# Patient Record
Sex: Female | Born: 1945 | Race: White | Hispanic: No | Marital: Married | Smoking: Former smoker
Health system: Southern US, Community
[De-identification: ages and names within clinical notes are randomized; demographics above are authoritative.]

## PROBLEM LIST (undated history)

## (undated) DIAGNOSIS — M722 Plantar fascial fibromatosis: Secondary | ICD-10-CM

## (undated) DIAGNOSIS — R32 Unspecified urinary incontinence: Secondary | ICD-10-CM

## (undated) DIAGNOSIS — D3610 Benign neoplasm of peripheral nerves and autonomic nervous system, unspecified: Secondary | ICD-10-CM

## (undated) DIAGNOSIS — R2231 Localized swelling, mass and lump, right upper limb: Secondary | ICD-10-CM

## (undated) DIAGNOSIS — K219 Gastro-esophageal reflux disease without esophagitis: Secondary | ICD-10-CM

## (undated) DIAGNOSIS — E039 Hypothyroidism, unspecified: Secondary | ICD-10-CM

## (undated) HISTORY — PX: SEPTOPLASTY: SUR1290

## (undated) HISTORY — PX: VENTRICULOPERITONEAL SHUNT: SHX204

## (undated) HISTORY — PX: COLON SURGERY: SHX602

## (undated) HISTORY — PX: DILATION AND CURETTAGE OF UTERUS: SHX78

## (undated) HISTORY — PX: RETROMASTOID CRANIOTOMY: SHX2341

## (undated) HISTORY — PX: ORIF ANKLE FRACTURE: SUR919

## (undated) HISTORY — PX: TUBAL LIGATION: SHX77

## (undated) HISTORY — PX: ABDOMINAL HYSTERECTOMY: SHX81

## (undated) HISTORY — PX: APPENDECTOMY: SHX54

---

## 1980-06-02 HISTORY — PX: BLADDER SURGERY: SHX569

## 1991-06-03 HISTORY — PX: BREAST REDUCTION SURGERY: SHX8

## 1991-06-03 HISTORY — PX: ABDOMINOPLASTY: SUR9

## 1998-01-16 ENCOUNTER — Other Ambulatory Visit: Admission: RE | Admit: 1998-01-16 | Discharge: 1998-01-16 | Payer: Self-pay | Admitting: Obstetrics and Gynecology

## 2001-04-22 ENCOUNTER — Encounter: Payer: Self-pay | Admitting: Obstetrics and Gynecology

## 2001-04-22 ENCOUNTER — Encounter: Admission: RE | Admit: 2001-04-22 | Discharge: 2001-04-22 | Payer: Self-pay | Admitting: Obstetrics and Gynecology

## 2001-04-22 ENCOUNTER — Other Ambulatory Visit: Admission: RE | Admit: 2001-04-22 | Discharge: 2001-04-22 | Payer: Self-pay | Admitting: Obstetrics and Gynecology

## 2002-05-10 ENCOUNTER — Other Ambulatory Visit: Admission: RE | Admit: 2002-05-10 | Discharge: 2002-05-10 | Payer: Self-pay | Admitting: Obstetrics and Gynecology

## 2003-05-23 ENCOUNTER — Other Ambulatory Visit: Admission: RE | Admit: 2003-05-23 | Discharge: 2003-05-23 | Payer: Self-pay | Admitting: Obstetrics and Gynecology

## 2004-01-08 ENCOUNTER — Encounter (INDEPENDENT_AMBULATORY_CARE_PROVIDER_SITE_OTHER): Payer: Self-pay | Admitting: *Deleted

## 2004-01-08 ENCOUNTER — Inpatient Hospital Stay (HOSPITAL_COMMUNITY): Admission: RE | Admit: 2004-01-08 | Discharge: 2004-01-11 | Payer: Self-pay | Admitting: Obstetrics and Gynecology

## 2006-08-06 ENCOUNTER — Emergency Department (HOSPITAL_COMMUNITY): Admission: EM | Admit: 2006-08-06 | Discharge: 2006-08-06 | Payer: Self-pay | Admitting: Emergency Medicine

## 2006-11-03 ENCOUNTER — Encounter: Admission: RE | Admit: 2006-11-03 | Discharge: 2006-11-03 | Payer: Self-pay | Admitting: Orthopedic Surgery

## 2007-01-26 ENCOUNTER — Encounter: Admission: RE | Admit: 2007-01-26 | Discharge: 2007-01-26 | Payer: Self-pay | Admitting: Obstetrics and Gynecology

## 2007-01-30 ENCOUNTER — Inpatient Hospital Stay (HOSPITAL_COMMUNITY): Admission: RE | Admit: 2007-01-30 | Discharge: 2007-01-31 | Payer: Self-pay | Admitting: Orthopedic Surgery

## 2008-06-02 HISTORY — PX: ANKLE HARDWARE REMOVAL: SHX1149

## 2010-10-01 ENCOUNTER — Other Ambulatory Visit: Payer: Self-pay | Admitting: Neurosurgery

## 2010-10-01 DIAGNOSIS — D333 Benign neoplasm of cranial nerves: Secondary | ICD-10-CM

## 2010-10-15 NOTE — Op Note (Signed)
NAMEDENEISE, GETTY              ACCOUNT NO.:  [id number]   MEDICAL RECORD NO.:  [id number]          PATIENT TYPE:  INP   LOCATION:  1530                         FACILITY:  [facility]   PHYSICIAN:  Georges Lynch. Gioffre, M.D.DATE OF BIRTH:  1945/10/07   DATE OF PROCEDURE:  01/29/2007  DATE OF DISCHARGE:                               OPERATIVE REPORT   SURGEON:  Georges Lynch. Darrelyn Hillock, M.D.   ASSISTANTDruscilla Brownie. Idolina Primer, P.A.-C.   PREOPERATIVE DIAGNOSIS:  Displaced bimalleolar fracture subluxation of  the left ankle.   POSTOPERATIVE DIAGNOSIS:  Displaced bimalleolar fracture subluxation of the left ankle.   OPERATION:  Open reduction and internal fixation of a bimalleolar  fracture distal subluxation, left ankle.   PROCEDURE:  Under general anesthesia, the patient first had 1 gram of IV  Ancef and a sterile prep was carried out of her ankle.  At that time,  the leg was exsanguinated and the Esmarch tourniquet was elevated to 325  mmHg.  Note, we utilized the mini C-arm in the operating room during the  procedure.  A medial incision was made over the medial malleolus.  She  had a large medial malleolar fracture fragment with two small fracture  fragments in the joint.  We displaced the medial malleolus and irrigated  the joint out and removed the fragments.  We then reduced the fracture,  held it in place with a towel clip.  I then inserted a guide pin across  the fracture site and then viewed the guide pin with the position with  the C-arm and it was an excellent position.  Following that, we then  measured the guide pin to be approximately 50 mm in length.  We then  made our drill hole in the appropriate fashion through the guide pin  with the cannulated drill through the guide pin.  Once again, we checked  the length with the C-arm.  Following that, we selected to use a 50 mm  length cortical screw with a washer across the fracture site.  We  reviewed the screw and it was in excellent  positions on the AP, mortise  view, and lateral view.  We thoroughly irrigated out the area and  reapproximated the wound site.  Following that, we then inspected the  fibula.  I did a closed reduction of the fibula and it was anatomic, so  I elected not to open fibular fracture site.  We closed the wound in  layers in the usual fashion far as the medial malleolar wound.  Great  care was taken not to injure the underlying sensory nerves.  We then  placed the patient in a short leg cast which was well padded and it will  be split in the recovery room.  Postop, she will be on a Coumadin  heparin protocol.           ______________________________  Georges Lynch. Darrelyn Hillock, M.D.     RAG/MEDQ  D:  01/29/2007  T:  01/30/2007  Job:  [id number]

## 2010-10-15 NOTE — H&P (Signed)
Julie Woodward, Julie Woodward              ACCOUNT NO.:  [id number]   MEDICAL RECORD NO.:  [id number]          PATIENT TYPE:  OIB   LOCATION:  1530                         FACILITY:  [facility]   PHYSICIAN:  Georges Lynch. Gioffre, M.D.DATE OF BIRTH:  11-26-1945   DATE OF ADMISSION:  01/29/2007  DATE OF DISCHARGE:                              HISTORY & PHYSICAL   CHIEF COMPLAINT:  Painful, swollen, deformed left ankle.   HISTORY OF PRESENT ILLNESS:  The patient is 65 year old female who  reports this morning she was cleaning up inside of her RV when she  slipped and fell, banging her left ankle and foot on the tub.  The  patient presented to the office this morning for evaluation.  She had an  obvious swollen, deformed left ankle.  X-rays revealed a medial and  lateral malleolar fracture with lateral displacement.   PAST MEDICAL HISTORY:  She had a recent dislocation of her left shoulder  back in March.   PAST SURGICAL HISTORY:  1. Appendectomy.  2. Partial cholecystectomy.  3. Hysterectomy.  4. Bladder tack.  5. Nasal surgery.  6. Bilateral foot surgery.  7. Breast reduction.  8. Tummy-tuck.   ALLERGIES:  DARVON.   PRIMARY CARE PHYSICIAN:  Dr. Rhea Belton.   REVIEW OF SYSTEMS:  Negative for any respiratory, cardiac, GI, GU or  neurologic issues.   PHYSICAL EXAMINATION:  VITALS:  Height is 5 feet 6.  Weight is 180  pounds, blood pressure is 128/82, pulse of 76 and regular, respirations  12, patient is afebrile.  GENERAL:  This is a healthy-appearing female who appears to be minimally  uncomfortable in a wheelchair.  HEENT:  Head is normocephalic.  Pupils equal, round and reactive.  NECK:  She had good range of motion without any discomfort.  CHEST:  Lung sounds were clear and equal bilaterally.  HEART:  Regular rate and rhythm, no murmurs, rubs or gallops.  ABDOMEN:  Soft.  Bowel sounds present.  EXTREMITIES:  Upper extremities had normal exam with range of motion of  shoulders, elbows and  wrists.  Lower Extremities: The patient had a significantly swollen and  ecchymotic left ankle with some deformity.  He's got good pulses and  good sensation, painful attempts at motion of the ankle.  She has good  motion of the knee and hip on the left, normal exam of the right.  BREAST, RECTAL AND GU:  Exams were deferred.   Review of x-rays taken in the office today shows both a medial and  lateral malleolar fracture with slight displacement.   PLAN:  The patient will be admitted to [facility] under the  care Dr. Ranee Gosselin.  Patient will undergo all routine labs and  tests prior to having an ORIF of her left ankle later today.      Jamelle Rushing, P.A.    ______________________________  Georges Lynch Darrelyn Hillock, M.D.    RWK/MEDQ  D:  01/29/2007  T:  01/30/2007  Job:  [id number]

## 2010-10-18 NOTE — H&P (Signed)
Julie Woodward, Julie Woodward                        ACCOUNT NO.:  [id number]   MEDICAL RECORD NO.:  [id number]                   PATIENT TYPE:  INP   LOCATION:  NA                                   FACILITY:  [facility]   PHYSICIAN:  Charles A. Sydnee Cabal, MD            DATE OF BIRTH:  [birth year]   DATE OF ADMISSION:  DATE OF DISCHARGE:                                HISTORY & PHYSICAL   CHIEF COMPLAINT:  Cystocele, mild uterine prolapse, small rectocele, stress  urinary incontinence.   A 65 year old para 3-0-0-3, SVD x3, notes over the last several years  progressive bulging, markedly worse over the last several months.  She feels  a bulge when she wipes.  Worse at the end of the day, resolves overnight.  She feels something that goes back in the vagina.  She thought she feel a  lump, questionable cervix, but mainly just a bulge.  She has difficulty with  urine, some urgency, rare urge incontinence, minor stress incontinence.  She  is not have to wear a pad at all times, better if she is voiding every 2-3  hours.  Her problem with urination is she starts to urinate, then she  finishes, then she has to go back and finish more, then has to go back and  finish more.  She does not think she is getting all the way finished with  single void but after all she does finish; she is completing a void after  these several tries.  She does not have any constant leaking or dribbling.  She has some pelvic pain, mainly pressure, sometimes referred to the back.  She does use some digital perineal pressure around the anus to achieve the  bowel movement but this is not replacement pressure into the vagina.  No  vaginal pressure is required and this mainly is when she is constipated.  She has seen Dr. Retta Diones who has performed formal urodynamics and planned  to have her undergo a sling procedure at the time of planned hysterectomy as  noted below.  However, with my judgment to proceed abdominally with  hysterectomy secondary to past history of complications in the abdomen, Dr.  Retta Diones recommended that I proceed with a Burch procedure and not going  with the sling procedure.  I discussed this with the patient and the patient  consents in this regard.  She gives her consent.  Accepts risks of  infection, bleeding, bowel and bladder damage, ureteral damage, blood  product risks of hepatitis and HIV exposure, prolonged catheter remaining in  situ and difficulty voiding, urinary tract infection, wound or incision  infection, fistula development, bowel resection, bowel involvement secondary  to complications as noted below, prolonged hospital stay from infection,  DVT, pulmonary embolus.   PAST MEDICAL HISTORY:  Hypercholesterolemia.   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  In her words, the surgeon stated that he had to take out some  bowel, copiously irrigate and break through adhesions of the bowel in the  pelvis and in the abdomen in order to achieve successful surgery to remove  the appendiceal abscess.  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  This was done in 1974 in [place] and records not available.  Other  surgery:  Bone spurs in her feet, deviated septum in nose, bilateral tubal  ligation, and abdominoplasty.  Bilateral tubal ligation was done in 1979 -  records not available.   MEDICATIONS:  1. Lipitor 20 mg h.s.  2. Currently phentermine and phendimetrazine but I have asked her to     discontinue these preoperatively temporarily.  3. Vagifem 25 mcg twice weekly in the vagina.  4. Other amino acids, antioxidants, calcium, magnesium, multivitamin Omega-3     menopausal formula herbal, and vitamin  E.   ALLERGIES:  No known drug allergies.   SOCIAL HISTORY:  No tobacco, ethanol, or drug use or STD exposure.  The  patient is married in a monogamous relationship with her husband.   FAMILY HISTORY:  Mother died at a young age of 18 from what they feel was  leukemia.  Father died in his 2s from emphysema.  Denies family history of  breast, uterus, ovary, cervix, colon cancer, or lymphoma; coronary artery  disease; stroke; diabetes; or hypertension.   REVIEW OF SYSTEMS:  She has some cystocele, uterine prolapse, urinary  incontinence, pelvic discomfort as noted above.  Denies fever, chills, skin  rashes, lesions, headaches, dizziness, some seasonal allergies. No chest  pain, shortness of breath, wheezing, diarrhea, constipation, bleeding,  melena, hematochezia, urgency, frequency, dysuria, incontinence, hematuria,  galactorrhea, or emotional changes.   PHYSICAL EXAMINATION:  VITAL SIGNS:  Blood pressure 120/90, respirations 16,  pulse 80.  Height 5 feet 6.5 inches, weight 161.6 pounds.  GENERAL:  Postmenopausal, no bleeding.  Alert and oriented x3, no distress.  General appearance good.  Mood and affect normal, development normal,  nutrition normal, grooming normal, no deformities.  HEENT:  Grossly within normal limits.  NECK:  Supple without thyromegaly or adenopathy.  LUNGS:  Clear bilaterally.  BACK:  NO CVAT.  Vertebral column nontender to palpation.  HEART:  Regular rate and rhythm without murmur, rub, or gallop.  BREAST:  Exam deferred given history of a normal breast exam with prior  gynecologist within the last 6 months.  ABDOMEN:  Soft, flat, and nontender.  Abdominoplasty and appendicitis-type  of vertical right lower quadrant scars are noted.  No masses are palpable,  no hernias are palpable, no hepatosplenomegaly noted.  PELVIC:  Normal external female genitalia.  Moderate cystocele.  Small to moderate uterine descensus with Valsalva and questionable descent of the   abdominal wall with tugging with a tenaculum on the uterus.  The tenaculum  does pull down the cervix to within about 2-3 cm from the introitus but not  down to the introitus.  There is a small rectocele.  Rectovaginal  examination confirms just a small rectocele and no evidence of an  enterocele.  Cystocele is midline.  On bimanual examination uterus not  enlarged, ovaries, not palpable, exam is nontender.   ASSESSMENT:  1. Cystocele.  2. Mild uterine prolapse.  3. Pelvic pain.  4. Small rectocele.  5. Stress urinary incontinence.  PLAN:  The patient will be admitted January 08, 2004 at 0700.  CBC, CMET, EKG,  anesthesia consult.  GoLYTELY bowel prep the evening prior.  Informed  consent is given as noted above.  Cefotan 1 g preoperatively.  SCTs  perioperatively.   Plan TAH/BSO, Burch procedure as feasible.  The patient understands that we  may have to abort any procedure if we find significant adhesive disease  precluding safe operation.  Furthermore, after TAH/BSO and Burch procedure  are done, will assess the vagina for support of the cystocele.  If  necessary, proceeding with anterior repair or posterior repair as indicated  based on judgment at the time of operation.  She was in agreement.  We will  proceed as outlined.                                               Charles A. Sydnee Cabal, MD    CAD/MEDQ  D:  12/27/2003  T:  12/27/2003  Job:  [id number]

## 2010-10-18 NOTE — Op Note (Signed)
[name], STAFFA                        ACCOUNT NO.:  [id number]   MEDICAL RECORD NO.:  [id number]                   PATIENT TYPE:  INP   LOCATION:  9399                                 FACILITY:  [facility]   PHYSICIAN:  Charles A. Sydnee Cabal, MD            DATE OF BIRTH:  13-May-1946   DATE OF PROCEDURE:  01/08/2004  DATE OF DISCHARGE:                                 OPERATIVE REPORT   PREOPERATIVE DIAGNOSES:  1. Uterine prolapse, mild.  2. Stress urinary incontinence.  3. Mild cystocele.  4. Mild rectocele.  5. Pelvic pain.   POSTOPERATIVE DIAGNOSES:  1. Uterine prolapse mild.  2. Stress urinary incontinence.  3. Cystocele.  4. Mild rectocele.  5. Pelvic adhesive disease.  6. Pelvic pain secondary to the above.   OPERATION PERFORMED:  1. Examination under anesthesia.  2. Exploratory laparotomy.  3. Lysis of adhesions moderate in nature.  4. Transabdominal hysterectomy, bilateral salpingo-oophorectomy.  5. Burch procedure.  6. Cystoscopy.   SURGEON:  Charles A. Sydnee Cabal, MD   ASSISTANT:  Rudy Jew. Ashley Royalty, M.D.   ANESTHESIA:  General endotracheal.   ESTIMATED BLOOD LOSS:  150 mL.   COMPLICATIONS:  None.   SPECIMENS:  Uterus, tubes and ovaries to pathology.   INSTRUMENT COUNT:  Sponge and instrument counts were correct times two.   OPERATIVE FINDINGS:  Normal tubes, uterus and ovaries.  Pelvic adhesive  disease noted in right lower quadrant.  Large bowel densely adhered to the  right adnexa and ovaries encompassing the right ovary and tube and to the  right cornual region.  Normal bladder was noted on cystoscopy.   DESCRIPTION OF PROCEDURE:  The patient was taken to the operating room,  placed in supine position.  General anesthetic was induced without  difficulty.  Sterile prep and drape was undertaken in dorsal lithotomy  position.  Pfannenstiel incision was carried down with a knife.  The fascia  was incised with a knife and Mayo scissors.  Rectus muscles  were sharply  dissected in the midline.  The peritoneum was entered with Metzenbaum  scissors carefully.  There was no damage to bowel, bladder or vascular  structures.  Balfour retractor was used.  Pelvis was explored.  Balfour  retractor was placed.  Moist laps were used to pack the bowel out of the  pelvis.  Sharp dissection was used to  dissect the bowel as noted off the  uterine cornual region.  There was no evidence of damage to the bowel.  Further dissection was used to isolate the infundibulopelvic pedicle on the  right.  This was carefully done. No damage to bowel or bladder or vascular  structures was noted.  This allowed bowel to be packed out of pelvis  adequately with adequate exposure.  After moderate amount of lysis of  adhesions, cornual clamps were placed in the uterine cornual regions  bilaterally.  Round ligaments were transected.  Infundibulopelvic pedicles  were  isolated, transected and transfixed, hemostasis was adequate.  Vesicouterine peritoneum was taken across the lower uterine segment.  The  bladder flap was taken down.  The uterine vessels were taken, transfixed and  simple stitch with good hemostasis resulted.  The cardinal ligaments were  then taken down to either side, transfixion stitch with good hemostasis.  Final clamps were placed around the cornual regions, the vaginal cornual  regions taking the vaginal angle and the uterosacral ligaments and these  were held.  The cervix was amputated from the vaginal cuff.  The vaginal  cuff was then closed with a __________ suture bilaterally and running  locking 0 Vicryl across the cuff.  Hemostasis was verified.  Irrigation was  carried out and all pedicles had good hemostasis.  Sutures and instruments  were removed.  Peritoneum was then closed.  Counts, laps were correct.  Peritoneum was closed with #2-0 Vicryl running nonlocking sutures.  Subfascial hemostasis was excellent.  Fascia was visualized inferiorly.   Retropubic space was then entered with blunt dissection, isolating the  bladder neck area. Foley catheter and urethra were palpated with adequate  visualization of this area.  Hand was inserted with fingers in the vagina  isolating the urethra with the Foley catheter in place between the first and  second digits and the tips of the fingers at the vaginal angle.  The bladder  neck was isolated.  nicely.  Under direct visualization and palpating the  area with fingers, a single suture on the left approximately 1 cm distal to  the bladder neck and approximately 1 cm lateral to the urethra was placed of  0 Ethibond.  This was taken up to Cooper's ligament and held on the right in  like location and like fashion, 0 Ethibond was placed in the endopelvic  fascia 1 cm distal to the bladder neck and 1 cm lateral to the urethra.  This was taken to Cooper's ligament and held. A second suture on the right  was placed 1 cm lateral to the first.  This was taken up to Cooper's  ligament and  held.  Integrity of the sutures was verified at both sides,  endopelvic fascia as well as clearance of the bladder neck and the urethra  and integrity of the Cooper's ligament site.  These were tied gently without  undue tension in banjo fashion.  There was clearly greater than a  fingerbreadth's clearance between the symphysis and the urethra and there  was no significant tenting effect of the sutures bilaterally of the urethra.  It was judged that adequate placement had been made well clear to the  bladder neck area itself and hemostasis was verified in the space and the  area was closed.  Fascia was closed with #1 Vicryl running and locking  suture.  Subcutaneous hemostasis was verified.  0 Vicryl, 2-0 Vicryl was  used for subcutaneous closure.  Sterile skin staples were applied.  Sterile  bandage was applied.  Attention was then given to the perineum.  The patient was then placed in universal stirrups in lithotomy  position.  Cystoscopy was  undertaken.  Using sterile water, 70 degree scope was placed.  Bladder neck  area was visualized as well as the remainder of the bladder.  There was no  evidence of damage to the bladder or stitches in the bladder.  Bladder neck  support was good.  Scope passed easily.  Catheter was removed and replaced  easily.  Examination under anesthesia was then undertaken  of the cystocele.  With the bladder drained, the cystocele was minimal.  There was moderate  support of the cystocele from the Burch procedure. It was judged that  further operative intervention was not required.  Further examination of the  rectocele after hysterectomy and support of the vaginal vault with the  uterosacral ligament vaginal vault suspension was undertaken.  Digital exam  of the rectum yielded findings of very small rectocele.  It was judged with  the support of the procedure from the repair already effected with surgery  and the patient's symptomatology, further repair was not indicated.  I had  discussed this with the patient prior to surgery that judgment would made at  the time of surgery based on examination and possibility of not proceeding  on with rectocele repair as well as cystocele repair was certainly a  possibility and she  gave informed consent for this plan as well.  At this time the procedure was  therefore terminated and the patient was taken to recovery with physician in  attendance after being extubated, having tolerated the procedure well.  Catheter had been replaced and routine postoperative course will be  undertaken as ordered.                                               Charles A. Sydnee Cabal, MD    CAD/MEDQ  D:  01/08/2004  T:  01/08/2004  Job:  [id number]

## 2010-10-18 NOTE — Discharge Summary (Signed)
Julie Woodward, Julie Woodward                        ACCOUNT NO.:  [id number]   MEDICAL RECORD NO.:  [id number]                   PATIENT TYPE:  INP   LOCATION:  9304                                 FACILITY:  [facility]   PHYSICIAN:  Charles A. Sydnee Cabal, MD            DATE OF BIRTH:  [birth year]   DATE OF ADMISSION:  01/08/2004  DATE OF DISCHARGE:                                 DISCHARGE SUMMARY   PRIMARY DISCHARGE DIAGNOSES:  1. Mild uterine prolapse.  2. Stress urinary incontinence.  3. Mild cystocele.  4. Small rectocele.  5. Pelvic pain.  6. Pelvic adhesive disease, moderate.   PROCEDURE:  Examination under anesthesia; exploratory laparotomy and lysis  of adhesions, moderate; transabdominal hysterectomy and bilateral salpingo-  oophorectomy; retropubic bladder neck suspension/Burch procedure;  cystoscopy.   DISPOSITION:  1. The patient discharged home to follow up in the office in 24 hours to     discontinue staples.  2. She was given convalescent instructions to rest at home; no lifting     greater than 25 pounds for 4 weeks; shower okay, no bath at this time for     10-14 days; no driving for 2 weeks.  Notify me of increased pain, any     vaginal bleeding of significance, any significant nausea or vomiting or     intolerance of p.o. intake.  Notify for temperature greater than 100     degrees.  3. She was given a prescription for Percocet 5/325 one to two p.o. q.4h.     p.r.n. #40 and Macrobid 100 mg p.o. b.i.d. for 10 days prophylactically.  4. She was discharged home with a Foley catheter and to return to the office     in 6 days for a voiding trial.   HISTORY AND PHYSICAL:  As dictated on the chart.   Pathology did return.  I have seen a copy of this at the office but a copy  is not available on the chart at this time.  This was benign.   HOSPITAL COURSE:  The patient was admitted, underwent surgery as noted  above.  Postoperatively, the patient had good pain control with  PCA.  Postoperative day #1 hematocrit was 31.4, hemoglobin 10.6.  She was out of  bed.  She showered in the evening after dressing was discontinued.  Clear  liquids were given, she tolerated this well.  She had no significant nausea  and vomiting.  Surgery was discussed postoperative day #2.  She continues to  do well.  Vital signs were stable.  She had good urine output.  A voiding  trial was begun.  She had no void with first attempt.  Second attempt, she  did pass a small amount which she felt but was audibly heard by her sister  when she was sitting on a toilet hat, a small amount felt to be 3-4  tablespoons, perhaps.  This was seen in the hat  and she did pass this amount  which was encouraging in that she did pass something.  She was unable to  void further.  The third try she had Foley catheter replaced.  On  postoperative day #3 she had been given general diet and tolerated this  well, flatus returned on postoperative day #2, and she had no nausea or  vomiting.  She continues to do well, was ambulating  without difficulty, pain was well controlled.  Catheter was reinserted once  again after two failed attempts to void and she is now discharged home with  follow-up as noted above with the catheter.  All questions were answered and  she will follow up as directed.                                               Charles A. Sydnee Cabal, MD    CAD/MEDQ  D:  01/11/2004  T:  01/12/2004  Job:  [id number]

## 2010-10-18 NOTE — Discharge Summary (Signed)
NAMESHONDREA, STEINERT              ACCOUNT NO.:  [id number]   MEDICAL RECORD NO.:  [id number]          PATIENT TYPE:  INP   LOCATION:  1530                         FACILITY:  [facility]   PHYSICIAN:  Georges Lynch. Gioffre, M.D.DATE OF BIRTH:  1946-03-27   DATE OF ADMISSION:  01/30/2007  DATE OF DISCHARGE:  01/31/2007                               DISCHARGE SUMMARY   ADMISSION DIAGNOSES:  1. Left ankle bimalleolar fracture.  2. Recent dislocation left shoulder.   DISCHARGE DIAGNOSES:  1. Left ankle ORIF.  2. Coumadin therapy for DVT prophylaxis.  3. Recent dislocation of left shoulder in March.   HISTORY OF PRESENT ILLNESS:  The patient is a 65 year old female who  slipped on the morning of admission.  She was seen in the office for  evaluation.  She had a left ankle bimalleolar fracture.  She was  admitted to the hospital, taken to the OR for an ORIF.   MEDICATIONS ON ADMISSION:  None.   ALLERGIES:  DARVOCET CAUSING RASH.   SURGICAL PROCEDURES:  On January 29, 2007, the patient was admitted to  [facility], taken to the OR by Dr. Worthy Rancher, assisted by  Cherly Beach PA-C.  Under general anesthesia, the patient underwent  an open reduction internal fixation of a left ankle bimalleolar  fracture.  The patient tolerated the procedure well.  There were no  complications.  The patient was transferred to the recovery room in good  condition.   CONSULTANT:  Following routine consults requested physical therapy, case  management, pharmacy for Coumadin dosing.   HOSPITAL COURSE:  On August 29, the patient was taken to [facility] where she was admitted and taken to the OR where Dr. Darrelyn Hillock  did an ORIF of the left ankle with a long screw through the medial  malleolus.  The distal fibula fracture was manipulated and had excellent  alignment in the anatomical position.  The patient was then casted.  The  patient was transferred to recovery room and then to the  orthopedic  floor in good condition.  The patient then spent 2 days postoperative  course on the orthopedic floor in which the patient was placed on  Coumadin for DVT prophylaxis.  Initially,  IV pain medicines.  She was  able to transition over to p.o. pain medications well.  The patient's  vital signs remained stable.  The patient tolerated the cast and was  able to ambulate with the crutches and she was felt to be orthopedically  stable on hospital day #2 with vitals which were stable.  She was  afebrile.  She had neurologically intact foot in a cast, and so she was  discharged to home with outpatient home health physical therapy for  ambulation training and follow-up with Dr. Darrelyn Hillock.   LABORATORY DATA:  On admission, CBC:  WBCs 9.4, hemoglobin 13.5,  hematocrit 39.0, platelets 244.  INR 0.9.  Routine chemistries:  Sodium  139, potassium 4.4, glucose 103, BUN 14, creatinine 0.70.  Estimated GFR  greater than 60.  EKG was normal sinus rhythm at 84 beats per minute.  DISCHARGE INSTRUCTIONS:  1. Activity: The patient is to ambulate with the use of a walker and      touchdown weightbearing left ankle.  2. Diet: No restrictions.  3. Wound care: The patient is to keep clean and dry.   FOLLOW UP:  The patient needs a follow-up appointment with Dr. Darrelyn Hillock  on September 3.  The patient is to call [phone] to make the  appointment.   Home health physical therapy through [facility]   MEDICATIONS:  1. Percocet 5 mg 1-2 tablets every 4-6 hours for pain if needed.  2. Robaxin 500 mg one tablet every 6 hours for pain if needed for      muscle spasms if needed.  3. Phenergan 25 mg one half to one tablet every 6 hours for nausea if      needed.   DISPOSITION:  The patient's condition upon discharge to home.   CONDITION ON DISCHARGE:  Listed as improved.      Jamelle Rushing, P.A.    ______________________________  Georges Lynch Darrelyn Hillock, M.D.    RWK/MEDQ  D:  02/09/2007  T:  02/10/2007   Job:  [id number]   cc:   Windy Fast A. Darrelyn Hillock, M.D.  Fax: [phone]

## 2010-11-12 ENCOUNTER — Ambulatory Visit
Admission: RE | Admit: 2010-11-12 | Discharge: 2010-11-12 | Disposition: A | Discharge status: Home / Self Care | Payer: PRIVATE HEALTH INSURANCE | Source: Ambulatory Visit | Attending: Neurosurgery | Admitting: Neurosurgery

## 2010-11-12 DIAGNOSIS — D333 Benign neoplasm of cranial nerves: Secondary | ICD-10-CM

## 2010-11-12 MED ORDER — GADOBENATE DIMEGLUMINE 529 MG/ML IV SOLN
16.0000 mL | Freq: Once | INTRAVENOUS | Status: AC | PRN
  Administered 2010-11-12: 16 mL via INTRAVENOUS

## 2010-12-11 ENCOUNTER — Encounter (HOSPITAL_COMMUNITY): Payer: PRIVATE HEALTH INSURANCE

## 2010-12-11 ENCOUNTER — Other Ambulatory Visit: Payer: Self-pay | Admitting: Urology

## 2010-12-11 LAB — CBC
HCT: 41.2 % (ref 36.0–46.0)
Hemoglobin: 13.2 g/dL (ref 12.0–15.0)
MCH: 28.6 pg (ref 26.0–34.0)
MCHC: 32 g/dL (ref 30.0–36.0)
MCV: 89.2 fL (ref 78.0–100.0)
RDW: 12.7 % (ref 11.5–15.5)

## 2010-12-11 LAB — SURGICAL PCR SCREEN: Staphylococcus aureus: NEGATIVE

## 2010-12-11 LAB — PROTIME-INR: INR: 0.9 (ref 0.00–1.49)

## 2010-12-20 ENCOUNTER — Observation Stay (HOSPITAL_COMMUNITY)
Admission: RE | Admit: 2010-12-20 | Discharge: 2010-12-21 | Disposition: A | Discharge status: Home / Self Care | Payer: PRIVATE HEALTH INSURANCE | Source: Ambulatory Visit | Attending: Urology | Admitting: Urology

## 2010-12-20 DIAGNOSIS — N3941 Urge incontinence: Secondary | ICD-10-CM | POA: Insufficient documentation

## 2010-12-20 DIAGNOSIS — N816 Rectocele: Secondary | ICD-10-CM | POA: Insufficient documentation

## 2010-12-20 DIAGNOSIS — K219 Gastro-esophageal reflux disease without esophagitis: Secondary | ICD-10-CM | POA: Insufficient documentation

## 2010-12-20 DIAGNOSIS — Z79899 Other long term (current) drug therapy: Secondary | ICD-10-CM | POA: Insufficient documentation

## 2010-12-20 DIAGNOSIS — Z01812 Encounter for preprocedural laboratory examination: Secondary | ICD-10-CM | POA: Insufficient documentation

## 2010-12-20 DIAGNOSIS — N8111 Cystocele, midline: Principal | ICD-10-CM | POA: Insufficient documentation

## 2010-12-20 LAB — ABO/RH: ABO/RH(D): B POS

## 2010-12-20 LAB — TYPE AND SCREEN
ABO/RH(D): B POS
Antibody Screen: NEGATIVE

## 2010-12-20 LAB — HEMOGLOBIN AND HEMATOCRIT, BLOOD: Hemoglobin: 12 g/dL (ref 12.0–15.0)

## 2010-12-20 LAB — BASIC METABOLIC PANEL
BUN: 12 mg/dL (ref 6–23)
Creatinine, Ser: 0.57 mg/dL (ref 0.50–1.10)
GFR calc non Af Amer: >60 mL/min (ref >60)
Glucose, Bld: 109 mg/dL — ABNORMAL HIGH (ref 70–99)
Potassium: 4.1 mEq/L (ref 3.5–5.1)

## 2010-12-21 LAB — BASIC METABOLIC PANEL
BUN: 8 mg/dL (ref 6–23)
CO2: 28 mEq/L (ref 19–32)
GFR calc Af Amer: >60 mL/min (ref >60)
GFR calc non Af Amer: >60 mL/min (ref >60)
Glucose, Bld: 95 mg/dL (ref 70–99)
Sodium: 139 mEq/L (ref 135–145)

## 2010-12-26 NOTE — Op Note (Signed)
NAMEIMAYA, DUFFY              ACCOUNT NO.:  [id number]  MEDICAL RECORD NO.:  [id number]  LOCATION:                                 FACILITY:  PHYSICIAN:  Martina Sinner, MD DATE OF BIRTH:  07-Aug-1945  DATE OF PROCEDURE:  12/20/2010 DATE OF DISCHARGE:                              OPERATIVE REPORT   SURGEON:  Martina Sinner, MD  ASSISTANT:  Delia Chimes, NP  PREOPERATIVE DIAGNOSIS:  Vault prolapse, cystocele, small rectocele surgery, vault suspension, cystocele repair plus graft plus cystoscopy.  POSTOPERATIVE DIAGNOSIS:  Vault prolapse, cystocele, small rectocele surgery, vault suspension, cystocele repair plus graft plus cystoscopy.  INDICATION:  Ms. Rosalia Hammers has the above diagnosis.  She has urge incontinence.  Preoperative laboratory tests were normal.  Preoperative antibiotics were given.  Next, extra care was taken with leg positioning to minimize the risk of compartment syndrome, neuropathy and DVT.  PROCEDURE IN DETAIL:  Under anesthesia, she had a somewhat shortened anterior vaginal wall, and her cuff was easily identified and distended from approximately 4 or 5.  She had a little bit of a trapdoor defect and a small central defect.  She had minimal posterior defect.  She had minimal apical defect posteriorly with the anterior wall reduced.  I marked the apex with 3-0 Vicryl.  I instilled 18 cc of lidocaine- epinephrine mixture underneath the vaginal mucosa.  I made my usual T- shaped incision anteriorly with Allises, and sharply and bluntly dissected the anterior vaginal wall from the underlying pubocervical fascia.  Without losing any length, I did a 2-layer closure of a small central defect like a small bubble.  I did not imbricate the bladder neck.  I was very happy with the repair.  I cystoscoped the patient, and there were excellent blue jets bilaterally.  There is no injury to bladder.  Endoscopically, the cystocele was reduced.  I had taken  down my double ring retractor to do this.  I bluntly dissected to the ischial spine bilaterally.  Left spine was quite flat.  I was diligent in clearing away soft tissue and could feel the flat sacrospinous ligaments bilaterally.  I placed a 0 Ethibond with Capio 1 fingerbreadth needle to the ischial spine on both sides in a straight line between the spines.  This was triple checked and I was very happy with position.  She had a rectal examination.  There is no injury to rectum and no suture in rectum.  I placed a 0 Vicryl on a UR-6 needle at the urethrovesical angle on the pelvic sidewall in usual technique.  10 x 6 dermal graft was sized in the shape of a trapezoid and sewn in place.  It was tension free.  In the midline at the apex, I sewed the graft to the vaginal apex.  I trimmed a minimal amount of vaginal mucosa bilaterally.  I closed the anterior vaginal wall with 2-0 Vicryl on CT-1 needle.  At the end of the case, she had very good length and good support anteriorly.  I repeated rectal examination.  There is no injury to rectum.  She had minimal diffuse bulging of her rectum and did not require posterior repair.  Blood loss was less than 200 mL.  Vaginal pack with Estrace cream was given.          ______________________________ Martina Sinner, MD     SAM/MEDQ  D:  12/20/2010  T:  12/20/2010  Job:  [id number]  Electronically Signed by Alfredo Martinez MD on 12/26/2010 11:14:40 AM

## 2011-03-10 ENCOUNTER — Other Ambulatory Visit: Payer: Self-pay | Admitting: Neurosurgery

## 2011-03-10 DIAGNOSIS — D333 Benign neoplasm of cranial nerves: Secondary | ICD-10-CM

## 2011-03-14 LAB — DIFFERENTIAL
Basophils Absolute: 0
Basophils Relative: 0
Eosinophils Absolute: 0
Eosinophils Relative: 0
Monocytes Absolute: 0.5
Monocytes Relative: 5
Neutro Abs: 7.7

## 2011-03-14 LAB — CBC
HCT: 39
Platelets: 244
WBC: 9.4

## 2011-03-14 LAB — COMPREHENSIVE METABOLIC PANEL
ALT: 44 — ABNORMAL HIGH
AST: 40 — ABNORMAL HIGH
Albumin: 4.2
Alkaline Phosphatase: 91
BUN: 14
Chloride: 105
Potassium: 4.4
Sodium: 139
Total Bilirubin: 0.7
Total Protein: 7.6

## 2011-04-29 ENCOUNTER — Ambulatory Visit
Admission: RE | Admit: 2011-04-29 | Discharge: 2011-04-29 | Disposition: A | Discharge status: Home / Self Care | Payer: Medicare Other | Source: Ambulatory Visit | Attending: Neurosurgery | Admitting: Neurosurgery

## 2011-04-29 DIAGNOSIS — D333 Benign neoplasm of cranial nerves: Secondary | ICD-10-CM

## 2011-04-29 MED ORDER — GADOBENATE DIMEGLUMINE 529 MG/ML IV SOLN
16.0000 mL | Freq: Once | INTRAVENOUS | Status: AC | PRN
  Administered 2011-04-29: 16 mL via INTRAVENOUS

## 2015-03-05 ENCOUNTER — Other Ambulatory Visit: Payer: Self-pay | Admitting: Gastroenterology

## 2015-03-05 DIAGNOSIS — D126 Benign neoplasm of colon, unspecified: Secondary | ICD-10-CM | POA: Diagnosis not present

## 2015-03-05 DIAGNOSIS — D128 Benign neoplasm of rectum: Secondary | ICD-10-CM | POA: Diagnosis not present

## 2015-03-05 DIAGNOSIS — K621 Rectal polyp: Secondary | ICD-10-CM | POA: Diagnosis not present

## 2015-03-05 DIAGNOSIS — K449 Diaphragmatic hernia without obstruction or gangrene: Secondary | ICD-10-CM | POA: Diagnosis not present

## 2015-03-05 DIAGNOSIS — K6389 Other specified diseases of intestine: Secondary | ICD-10-CM | POA: Diagnosis not present

## 2015-03-05 DIAGNOSIS — Z1211 Encounter for screening for malignant neoplasm of colon: Secondary | ICD-10-CM | POA: Diagnosis not present

## 2015-03-05 DIAGNOSIS — K219 Gastro-esophageal reflux disease without esophagitis: Secondary | ICD-10-CM | POA: Diagnosis not present

## 2015-03-05 DIAGNOSIS — K5730 Diverticulosis of large intestine without perforation or abscess without bleeding: Secondary | ICD-10-CM | POA: Diagnosis not present

## 2015-04-12 DIAGNOSIS — Z86018 Personal history of other benign neoplasm: Secondary | ICD-10-CM | POA: Diagnosis not present

## 2015-04-12 DIAGNOSIS — Z9889 Other specified postprocedural states: Secondary | ICD-10-CM | POA: Diagnosis not present

## 2015-04-12 DIAGNOSIS — Z982 Presence of cerebrospinal fluid drainage device: Secondary | ICD-10-CM | POA: Diagnosis not present

## 2015-04-12 DIAGNOSIS — Z09 Encounter for follow-up examination after completed treatment for conditions other than malignant neoplasm: Secondary | ICD-10-CM | POA: Diagnosis not present

## 2015-07-03 DIAGNOSIS — E8840 Mitochondrial metabolism disorder, unspecified: Secondary | ICD-10-CM | POA: Diagnosis not present

## 2015-07-03 DIAGNOSIS — G609 Hereditary and idiopathic neuropathy, unspecified: Secondary | ICD-10-CM | POA: Diagnosis not present

## 2015-07-03 DIAGNOSIS — R5383 Other fatigue: Secondary | ICD-10-CM | POA: Diagnosis not present

## 2015-07-03 DIAGNOSIS — E509 Vitamin A deficiency, unspecified: Secondary | ICD-10-CM | POA: Diagnosis not present

## 2015-07-03 DIAGNOSIS — E039 Hypothyroidism, unspecified: Secondary | ICD-10-CM | POA: Diagnosis not present

## 2015-07-03 DIAGNOSIS — E639 Nutritional deficiency, unspecified: Secondary | ICD-10-CM | POA: Diagnosis not present

## 2015-07-03 DIAGNOSIS — E559 Vitamin D deficiency, unspecified: Secondary | ICD-10-CM | POA: Diagnosis not present

## 2015-07-03 DIAGNOSIS — E279 Disorder of adrenal gland, unspecified: Secondary | ICD-10-CM | POA: Diagnosis not present

## 2015-07-03 DIAGNOSIS — E7210 Disorders of sulfur-bearing amino-acid metabolism, unspecified: Secondary | ICD-10-CM | POA: Diagnosis not present

## 2015-07-03 DIAGNOSIS — N951 Menopausal and female climacteric states: Secondary | ICD-10-CM | POA: Diagnosis not present

## 2015-07-16 DIAGNOSIS — Z86010 Personal history of colonic polyps: Secondary | ICD-10-CM | POA: Diagnosis not present

## 2015-07-16 DIAGNOSIS — K219 Gastro-esophageal reflux disease without esophagitis: Secondary | ICD-10-CM | POA: Diagnosis not present

## 2015-08-07 DIAGNOSIS — E063 Autoimmune thyroiditis: Secondary | ICD-10-CM | POA: Diagnosis not present

## 2015-08-15 DIAGNOSIS — Z0000 Encounter for general adult medical examination without abnormal findings: Secondary | ICD-10-CM | POA: Diagnosis not present

## 2015-08-15 DIAGNOSIS — Z13820 Encounter for screening for osteoporosis: Secondary | ICD-10-CM | POA: Diagnosis not present

## 2015-08-15 DIAGNOSIS — K219 Gastro-esophageal reflux disease without esophagitis: Secondary | ICD-10-CM | POA: Diagnosis not present

## 2015-08-15 DIAGNOSIS — Z23 Encounter for immunization: Secondary | ICD-10-CM | POA: Diagnosis not present

## 2015-08-15 DIAGNOSIS — E7800 Pure hypercholesterolemia, unspecified: Secondary | ICD-10-CM | POA: Diagnosis not present

## 2015-09-18 DIAGNOSIS — M859 Disorder of bone density and structure, unspecified: Secondary | ICD-10-CM | POA: Diagnosis not present

## 2015-09-18 DIAGNOSIS — M8589 Other specified disorders of bone density and structure, multiple sites: Secondary | ICD-10-CM | POA: Diagnosis not present

## 2015-09-21 DIAGNOSIS — G609 Hereditary and idiopathic neuropathy, unspecified: Secondary | ICD-10-CM | POA: Diagnosis not present

## 2015-09-21 DIAGNOSIS — E7210 Disorders of sulfur-bearing amino-acid metabolism, unspecified: Secondary | ICD-10-CM | POA: Diagnosis not present

## 2015-09-21 DIAGNOSIS — E039 Hypothyroidism, unspecified: Secondary | ICD-10-CM | POA: Diagnosis not present

## 2015-09-21 DIAGNOSIS — E559 Vitamin D deficiency, unspecified: Secondary | ICD-10-CM | POA: Diagnosis not present

## 2015-09-21 DIAGNOSIS — E7800 Pure hypercholesterolemia, unspecified: Secondary | ICD-10-CM | POA: Diagnosis not present

## 2015-11-26 DIAGNOSIS — M838 Other adult osteomalacia: Secondary | ICD-10-CM | POA: Diagnosis not present

## 2015-11-27 DIAGNOSIS — D485 Neoplasm of uncertain behavior of skin: Secondary | ICD-10-CM | POA: Diagnosis not present

## 2015-11-27 DIAGNOSIS — C44319 Basal cell carcinoma of skin of other parts of face: Secondary | ICD-10-CM | POA: Diagnosis not present

## 2015-11-27 DIAGNOSIS — L821 Other seborrheic keratosis: Secondary | ICD-10-CM | POA: Diagnosis not present

## 2015-11-27 DIAGNOSIS — L853 Xerosis cutis: Secondary | ICD-10-CM | POA: Diagnosis not present

## 2015-12-18 DIAGNOSIS — C44319 Basal cell carcinoma of skin of other parts of face: Secondary | ICD-10-CM | POA: Diagnosis not present

## 2016-01-22 DIAGNOSIS — E7800 Pure hypercholesterolemia, unspecified: Secondary | ICD-10-CM | POA: Diagnosis not present

## 2016-01-22 DIAGNOSIS — E039 Hypothyroidism, unspecified: Secondary | ICD-10-CM | POA: Diagnosis not present

## 2016-01-22 DIAGNOSIS — R739 Hyperglycemia, unspecified: Secondary | ICD-10-CM | POA: Diagnosis not present

## 2016-01-22 DIAGNOSIS — Z22322 Carrier or suspected carrier of Methicillin resistant Staphylococcus aureus: Secondary | ICD-10-CM | POA: Diagnosis not present

## 2016-01-22 DIAGNOSIS — E7210 Disorders of sulfur-bearing amino-acid metabolism, unspecified: Secondary | ICD-10-CM | POA: Diagnosis not present

## 2016-01-22 DIAGNOSIS — R799 Abnormal finding of blood chemistry, unspecified: Secondary | ICD-10-CM | POA: Diagnosis not present

## 2016-01-22 DIAGNOSIS — D509 Iron deficiency anemia, unspecified: Secondary | ICD-10-CM | POA: Diagnosis not present

## 2016-01-22 DIAGNOSIS — E559 Vitamin D deficiency, unspecified: Secondary | ICD-10-CM | POA: Diagnosis not present

## 2016-01-22 DIAGNOSIS — E060 Acute thyroiditis: Secondary | ICD-10-CM | POA: Diagnosis not present

## 2016-01-22 DIAGNOSIS — E279 Disorder of adrenal gland, unspecified: Secondary | ICD-10-CM | POA: Diagnosis not present

## 2016-02-27 DIAGNOSIS — C44319 Basal cell carcinoma of skin of other parts of face: Secondary | ICD-10-CM | POA: Diagnosis not present

## 2016-03-18 DIAGNOSIS — R0981 Nasal congestion: Secondary | ICD-10-CM | POA: Diagnosis not present

## 2016-03-18 DIAGNOSIS — J069 Acute upper respiratory infection, unspecified: Secondary | ICD-10-CM | POA: Diagnosis not present

## 2016-03-21 DIAGNOSIS — H5203 Hypermetropia, bilateral: Secondary | ICD-10-CM | POA: Diagnosis not present

## 2016-04-10 DIAGNOSIS — Z8669 Personal history of other diseases of the nervous system and sense organs: Secondary | ICD-10-CM | POA: Diagnosis not present

## 2016-04-10 DIAGNOSIS — J343 Hypertrophy of nasal turbinates: Secondary | ICD-10-CM | POA: Diagnosis not present

## 2016-04-10 DIAGNOSIS — J342 Deviated nasal septum: Secondary | ICD-10-CM | POA: Diagnosis not present

## 2016-04-10 DIAGNOSIS — Z87891 Personal history of nicotine dependence: Secondary | ICD-10-CM | POA: Diagnosis not present

## 2016-04-28 DIAGNOSIS — E279 Disorder of adrenal gland, unspecified: Secondary | ICD-10-CM | POA: Diagnosis not present

## 2016-05-17 DIAGNOSIS — M19072 Primary osteoarthritis, left ankle and foot: Secondary | ICD-10-CM | POA: Diagnosis not present

## 2016-06-16 DIAGNOSIS — H02834 Dermatochalasis of left upper eyelid: Secondary | ICD-10-CM | POA: Diagnosis not present

## 2016-06-16 DIAGNOSIS — H02831 Dermatochalasis of right upper eyelid: Secondary | ICD-10-CM | POA: Diagnosis not present

## 2016-08-18 DIAGNOSIS — H02834 Dermatochalasis of left upper eyelid: Secondary | ICD-10-CM | POA: Diagnosis not present

## 2016-08-18 DIAGNOSIS — H02831 Dermatochalasis of right upper eyelid: Secondary | ICD-10-CM | POA: Diagnosis not present

## 2017-04-28 DIAGNOSIS — M2550 Pain in unspecified joint: Secondary | ICD-10-CM | POA: Diagnosis not present

## 2017-04-28 DIAGNOSIS — K219 Gastro-esophageal reflux disease without esophagitis: Secondary | ICD-10-CM | POA: Diagnosis not present

## 2017-04-28 DIAGNOSIS — Z23 Encounter for immunization: Secondary | ICD-10-CM | POA: Diagnosis not present

## 2017-04-28 DIAGNOSIS — R0981 Nasal congestion: Secondary | ICD-10-CM | POA: Diagnosis not present

## 2017-04-28 DIAGNOSIS — H5203 Hypermetropia, bilateral: Secondary | ICD-10-CM | POA: Diagnosis not present

## 2017-04-28 DIAGNOSIS — R6889 Other general symptoms and signs: Secondary | ICD-10-CM | POA: Diagnosis not present

## 2017-07-08 DIAGNOSIS — D2272 Melanocytic nevi of left lower limb, including hip: Secondary | ICD-10-CM | POA: Diagnosis not present

## 2017-07-08 DIAGNOSIS — Z85828 Personal history of other malignant neoplasm of skin: Secondary | ICD-10-CM | POA: Diagnosis not present

## 2017-07-08 DIAGNOSIS — L814 Other melanin hyperpigmentation: Secondary | ICD-10-CM | POA: Diagnosis not present

## 2017-07-08 DIAGNOSIS — L821 Other seborrheic keratosis: Secondary | ICD-10-CM | POA: Diagnosis not present

## 2017-07-08 DIAGNOSIS — D1801 Hemangioma of skin and subcutaneous tissue: Secondary | ICD-10-CM | POA: Diagnosis not present

## 2017-07-08 DIAGNOSIS — L570 Actinic keratosis: Secondary | ICD-10-CM | POA: Diagnosis not present

## 2017-07-08 DIAGNOSIS — D2271 Melanocytic nevi of right lower limb, including hip: Secondary | ICD-10-CM | POA: Diagnosis not present

## 2017-07-30 DIAGNOSIS — D1721 Benign lipomatous neoplasm of skin and subcutaneous tissue of right arm: Secondary | ICD-10-CM | POA: Diagnosis not present

## 2017-07-30 DIAGNOSIS — D1722 Benign lipomatous neoplasm of skin and subcutaneous tissue of left arm: Secondary | ICD-10-CM | POA: Diagnosis not present

## 2017-09-01 DIAGNOSIS — D333 Benign neoplasm of cranial nerves: Secondary | ICD-10-CM | POA: Diagnosis not present

## 2017-09-01 DIAGNOSIS — Z982 Presence of cerebrospinal fluid drainage device: Secondary | ICD-10-CM | POA: Diagnosis not present

## 2017-09-01 DIAGNOSIS — Z9889 Other specified postprocedural states: Secondary | ICD-10-CM | POA: Diagnosis not present

## 2017-09-01 DIAGNOSIS — Z885 Allergy status to narcotic agent status: Secondary | ICD-10-CM | POA: Diagnosis not present

## 2017-12-21 DIAGNOSIS — M79671 Pain in right foot: Secondary | ICD-10-CM | POA: Diagnosis not present

## 2017-12-21 DIAGNOSIS — M79672 Pain in left foot: Secondary | ICD-10-CM | POA: Diagnosis not present

## 2018-01-18 DIAGNOSIS — Z23 Encounter for immunization: Secondary | ICD-10-CM | POA: Diagnosis not present

## 2018-01-18 DIAGNOSIS — K219 Gastro-esophageal reflux disease without esophagitis: Secondary | ICD-10-CM | POA: Diagnosis not present

## 2018-01-18 DIAGNOSIS — Z1389 Encounter for screening for other disorder: Secondary | ICD-10-CM | POA: Diagnosis not present

## 2018-01-18 DIAGNOSIS — M8588 Other specified disorders of bone density and structure, other site: Secondary | ICD-10-CM | POA: Diagnosis not present

## 2018-01-18 DIAGNOSIS — Z0000 Encounter for general adult medical examination without abnormal findings: Secondary | ICD-10-CM | POA: Diagnosis not present

## 2018-01-18 DIAGNOSIS — E7800 Pure hypercholesterolemia, unspecified: Secondary | ICD-10-CM | POA: Diagnosis not present

## 2018-01-18 DIAGNOSIS — R6889 Other general symptoms and signs: Secondary | ICD-10-CM | POA: Diagnosis not present

## 2018-01-18 DIAGNOSIS — D1721 Benign lipomatous neoplasm of skin and subcutaneous tissue of right arm: Secondary | ICD-10-CM | POA: Diagnosis not present

## 2018-01-20 DIAGNOSIS — M722 Plantar fascial fibromatosis: Secondary | ICD-10-CM | POA: Diagnosis not present

## 2018-02-02 ENCOUNTER — Other Ambulatory Visit: Payer: Self-pay | Admitting: General Surgery

## 2018-02-02 DIAGNOSIS — R2231 Localized swelling, mass and lump, right upper limb: Secondary | ICD-10-CM | POA: Diagnosis not present

## 2018-02-04 ENCOUNTER — Other Ambulatory Visit: Payer: Self-pay

## 2018-02-04 ENCOUNTER — Encounter (HOSPITAL_BASED_OUTPATIENT_CLINIC_OR_DEPARTMENT_OTHER): Payer: Self-pay

## 2018-02-08 NOTE — H&P (Signed)
Julie Woodward Documented: 02/02/2018 10:29 AM Location: [facility] Patient #: [id number] DOB: 1945-06-19 Married / Language: Julie Woodward / Race: White Female   History of Present Illness Julie Klein MD; 02/02/2018 11:26 AM) The patient is a 72 year old female who presents with skin lesions. Pt is a 72 yo F referred by Dr. Tamala Woodward for a right arm mass. She has had this for several years and it has been getting larger. It is not particularly painful, but it is bothersome. It is on the upper inner arm near the axilla and is uncomfortable with certain motions of the arm. She has had prior lipomas and it feels similar other than the enlargement. She is otherwise healthy. She had not had cancer.    Past Surgical History Julie Woodward; 02/02/2018 10:29 AM) Appendectomy  Foot Surgery  Bilateral. Hysterectomy (not due to cancer) - Complete  Resection of Small Bowel   Diagnostic Studies History Julie Woodward; 02/02/2018 10:29 AM) Colonoscopy  1-5 years ago Mammogram  >3 years ago Pap Smear  >5 years ago  Allergies Julie Woodward; 02/02/2018 10:30 AM) Morphine Derivatives  Nausea, Vomiting. Allergies Reconciled   Medication History Julie Woodward; 02/02/2018 10:33 AM) Boostrix (5-2.5-18.5LF-MCG/0.5 Suspension, Intramuscular) Active. Calcium (Oral) Specific strength unknown - Active. Coenzyme Q10 (Oral) Specific strength unknown - Active. Collagen (Oral) Specific strength unknown - Active. Fish Oil (Oral) Specific strength unknown - Active. Magnesium (Oral) Specific strength unknown - Active. Multi Vitamin (Oral) Specific strength unknown - Active. Lysine (Oral) Specific strength unknown - Active. Vitamin C (Oral) Specific strength unknown - Active. Vitamin D3 (Oral) Specific strength unknown - Active. Vitamin E (Oral) Specific strength unknown - Active. RaNITidine HCl (150MG  Capsule, Oral) Active. Medications Reconciled  Social History  Julie Woodward; 02/02/2018 10:29 AM) Alcohol use  Occasional alcohol use. No drug use  Tobacco use  Former smoker.  Family History Julie Woodward; 02/02/2018 10:29 AM) Thyroid problems  Julie Woodward.  Pregnancy / Birth History Julie Woodward; 02/02/2018 10:29 AM) Age at menarche  13 years. Age of menopause  2-50 Gravida  4 Maternal age  51-20 Para  3  Other Problems Julie Woodward; 02/02/2018 10:29 AM) Gastroesophageal Reflux Disease  Thyroid Disease     Review of Systems Julie Woodward; 02/02/2018 10:29 AM) General Present- Chills and Fatigue. Not Present- Appetite Loss, Fever, Night Sweats, Weight Gain and Weight Loss. Skin Present- Dryness. Not Present- Change in Wart/Mole, Hives, Jaundice, New Lesions, Non-Healing Wounds, Rash and Ulcer. HEENT Present- Hearing Loss, Seasonal Allergies and Wears glasses/contact lenses. Not Present- Earache, Hoarseness, Nose Bleed, Oral Ulcers, Ringing in the Ears, Sinus Pain, Sore Throat, Visual Disturbances and Yellow Eyes. Respiratory Present- Difficulty Breathing. Not Present- Bloody sputum, Chronic Cough, Snoring and Wheezing. Breast Not Present- Breast Mass, Breast Pain, Nipple Discharge and Skin Changes. Cardiovascular Present- Leg Cramps. Not Present- Chest Pain, Difficulty Breathing Lying Down, Palpitations, Rapid Heart Rate, Shortness of Breath and Swelling of Extremities. Gastrointestinal Present- Indigestion. Not Present- Abdominal Pain, Bloating, Bloody Stool, Change in Bowel Habits, Chronic diarrhea, Constipation, Difficulty Swallowing, Excessive gas, Gets full quickly at meals, Hemorrhoids, Nausea, Rectal Pain and Vomiting. Female Genitourinary Not Present- Frequency, Nocturia, Painful Urination, Pelvic Pain and Urgency. Musculoskeletal Present- Back Pain and Muscle Pain. Not Present- Joint Pain, Joint Stiffness, Muscle Weakness and Swelling of Extremities. Neurological Present- Decreased Memory. Not Present-  Fainting, Headaches, Numbness, Seizures, Tingling, Tremor, Trouble walking and Weakness. Psychiatric Not Present- Anxiety, Bipolar, Change in Sleep Pattern, Depression, Fearful and Frequent crying. Endocrine Present- Cold Intolerance. Not Present-  Excessive Hunger, Hair Changes, Heat Intolerance, Hot flashes and New Diabetes. Hematology Not Present- Blood Thinners, Easy Bruising, Excessive bleeding, Gland problems, HIV and Persistent Infections.  Vitals Julie Woodward; 02/02/2018 10:34 AM) 02/02/2018 10:33 AM Weight: 183.4 lb Height: 65in Height was reported by patient. Body Surface Area: 1.91 m Body Mass Index: 30.52 kg/m  Pain Level: 5/10 Temp.: 97.94F(Temporal)  Pulse: 107 (Regular)  P.OX: 96% (Room air) BP: 132/84 (Sitting, Left Arm, Standard) Plantar Fascitis Pain      Physical Exam Julie Klein MD; 02/02/2018 11:27 AM) General Mental Status-Alert. General Appearance-Consistent with stated age. Hydration-Well hydrated. Voice-Normal.  Integumentary Note: 5-6 cm mass on right upper arm at anteromedial aspect of deltoid insertion. It is mobile. It feels like a portion of it is deeper and may be inserting into the muscle. It is relatively soft.  There is also a small similar lesion near scar from previous lipoma incision on lateral left wrist.   Head and Neck Head-normocephalic, atraumatic with no lesions or palpable masses. Trachea-midline. Thyroid Gland Characteristics - normal size and consistency.  Eye Eyeball - Bilateral-Extraocular movements intact. Sclera/Conjunctiva - Bilateral-No scleral icterus.  Chest and Lung Exam Chest and lung exam reveals -quiet, even and easy respiratory effort with no use of accessory muscles and on auscultation, normal breath sounds, no adventitious sounds and normal vocal resonance. Inspection Chest Wall - Normal. Back - normal.  Cardiovascular Cardiovascular examination reveals -normal heart  sounds, regular rate and rhythm with no murmurs and normal pedal pulses bilaterally.  Abdomen Inspection Inspection of the abdomen reveals - No Hernias. Palpation/Percussion Palpation and Percussion of the abdomen reveal - Soft, Non Tender, No Rebound tenderness, No Rigidity (guarding) and No hepatosplenomegaly. Auscultation Auscultation of the abdomen reveals - Bowel sounds normal.  Neurologic Neurologic evaluation reveals -alert and oriented x 3 with no impairment of recent or remote memory. Mental Status-Normal.  Musculoskeletal Global Assessment -Note: no gross deformities.  Normal Exam - Left-Upper Extremity Strength Normal and Lower Extremity Strength Normal. Normal Exam - Right-Upper Extremity Strength Normal and Lower Extremity Strength Normal.  Lymphatic Head & Neck  General Head & Neck Lymphatics: Bilateral - Description - Normal. Axillary  General Axillary Region: Bilateral - Description - Normal. Tenderness - Non Tender. Femoral & Inguinal  Generalized Femoral & Inguinal Lymphatics: Bilateral - Description - No Generalized lymphadenopathy.    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  Will plan excision. Discussed risks of surgery including bleeding, infection, numbness, wound issues and more.  She wants to proceed.  I discussed post op restrictions. Current Plans Schedule for Surgery   Signed by Julie Klein, MD (02/02/2018 11:28 AM)

## 2018-02-08 NOTE — Pre-Procedure Instructions (Signed)
Ensure pre-surgery drink 10 oz. given to pt. with instructions to drink by 1000 DOS; pt. voiced understanding.

## 2018-02-10 ENCOUNTER — Ambulatory Visit (HOSPITAL_BASED_OUTPATIENT_CLINIC_OR_DEPARTMENT_OTHER)
Admission: RE | Admit: 2018-02-10 | Discharge: 2018-02-10 | Disposition: A | Discharge status: Home / Self Care | Payer: Medicare HMO | Source: Ambulatory Visit | Attending: General Surgery | Admitting: General Surgery

## 2018-02-10 ENCOUNTER — Ambulatory Visit (HOSPITAL_BASED_OUTPATIENT_CLINIC_OR_DEPARTMENT_OTHER): Payer: Medicare HMO | Admitting: Anesthesiology

## 2018-02-10 ENCOUNTER — Other Ambulatory Visit: Payer: Self-pay

## 2018-02-10 ENCOUNTER — Encounter (HOSPITAL_BASED_OUTPATIENT_CLINIC_OR_DEPARTMENT_OTHER)
Admission: RE | Disposition: A | Discharge status: Home / Self Care | Payer: Self-pay | Source: Ambulatory Visit | Attending: General Surgery

## 2018-02-10 ENCOUNTER — Encounter (HOSPITAL_BASED_OUTPATIENT_CLINIC_OR_DEPARTMENT_OTHER): Payer: Self-pay | Admitting: Anesthesiology

## 2018-02-10 DIAGNOSIS — R2231 Localized swelling, mass and lump, right upper limb: Secondary | ICD-10-CM | POA: Diagnosis not present

## 2018-02-10 DIAGNOSIS — K219 Gastro-esophageal reflux disease without esophagitis: Secondary | ICD-10-CM | POA: Diagnosis not present

## 2018-02-10 DIAGNOSIS — Z79899 Other long term (current) drug therapy: Secondary | ICD-10-CM | POA: Diagnosis not present

## 2018-02-10 DIAGNOSIS — Z885 Allergy status to narcotic agent status: Secondary | ICD-10-CM | POA: Diagnosis not present

## 2018-02-10 DIAGNOSIS — Z87891 Personal history of nicotine dependence: Secondary | ICD-10-CM | POA: Diagnosis not present

## 2018-02-10 DIAGNOSIS — D1721 Benign lipomatous neoplasm of skin and subcutaneous tissue of right arm: Secondary | ICD-10-CM | POA: Diagnosis not present

## 2018-02-10 HISTORY — DX: Localized swelling, mass and lump, right upper limb: R22.31

## 2018-02-10 HISTORY — DX: Hypothyroidism, unspecified: E03.9

## 2018-02-10 HISTORY — DX: Plantar fascial fibromatosis: M72.2

## 2018-02-10 HISTORY — DX: Gastro-esophageal reflux disease without esophagitis: K21.9

## 2018-02-10 HISTORY — PX: MASS EXCISION: SHX2000

## 2018-02-10 HISTORY — DX: Benign neoplasm of peripheral nerves and autonomic nervous system, unspecified: D36.10

## 2018-02-10 HISTORY — DX: Unspecified urinary incontinence: R32

## 2018-02-10 SURGERY — EXCISION MASS
Anesthesia: General | Site: Arm Upper | Laterality: Right

## 2018-02-10 MED ORDER — ACETAMINOPHEN 500 MG PO TABS
1000.0000 mg | ORAL_TABLET | ORAL | Status: AC
  Administered 2018-02-10: 1000 mg via ORAL

## 2018-02-10 MED ORDER — GABAPENTIN 300 MG PO CAPS
ORAL_CAPSULE | ORAL | Status: AC
  Filled 2018-02-10: qty 1

## 2018-02-10 MED ORDER — BUPIVACAINE-EPINEPHRINE (PF) 0.25% -1:200000 IJ SOLN
INTRAMUSCULAR | Status: AC
  Filled 2018-02-10: qty 30

## 2018-02-10 MED ORDER — OXYCODONE HCL 5 MG PO TABS: 5.0000 mg | ORAL_TABLET | Freq: Four times a day (QID) | ORAL | 0 refills | Status: DC | PRN

## 2018-02-10 MED ORDER — DEXAMETHASONE SODIUM PHOSPHATE 10 MG/ML IJ SOLN
INTRAMUSCULAR | Status: AC
  Filled 2018-02-10: qty 1

## 2018-02-10 MED ORDER — ONDANSETRON HCL 4 MG/2ML IJ SOLN
INTRAMUSCULAR | Status: DC | PRN
  Administered 2018-02-10: 4 mg via INTRAVENOUS

## 2018-02-10 MED ORDER — MIDAZOLAM HCL 2 MG/2ML IJ SOLN: 1.0000 mg | INTRAMUSCULAR | Status: DC | PRN

## 2018-02-10 MED ORDER — LACTATED RINGERS IV SOLN
INTRAVENOUS | Status: DC
  Administered 2018-02-10 (×2): via INTRAVENOUS

## 2018-02-10 MED ORDER — FENTANYL CITRATE (PF) 100 MCG/2ML IJ SOLN
50.0000 ug | INTRAMUSCULAR | Status: DC | PRN
  Administered 2018-02-10: 50 ug via INTRAVENOUS

## 2018-02-10 MED ORDER — CEFAZOLIN SODIUM-DEXTROSE 2-4 GM/100ML-% IV SOLN: 2.0000 g | INTRAVENOUS | Status: DC

## 2018-02-10 MED ORDER — LIDOCAINE 2% (20 MG/ML) 5 ML SYRINGE
INTRAMUSCULAR | Status: DC | PRN
  Administered 2018-02-10: 60 mg via INTRAVENOUS

## 2018-02-10 MED ORDER — CHLORHEXIDINE GLUCONATE CLOTH 2 % EX PADS: 6.0000 | MEDICATED_PAD | Freq: Once | CUTANEOUS | Status: DC

## 2018-02-10 MED ORDER — PROPOFOL 10 MG/ML IV BOLUS
INTRAVENOUS | Status: DC | PRN
  Administered 2018-02-10: 150 mg via INTRAVENOUS

## 2018-02-10 MED ORDER — LIDOCAINE HCL 1 % IJ SOLN
INTRAMUSCULAR | Status: DC | PRN
  Administered 2018-02-10: 14 mL via INTRAMUSCULAR

## 2018-02-10 MED ORDER — CEFAZOLIN SODIUM-DEXTROSE 2-4 GM/100ML-% IV SOLN
INTRAVENOUS | Status: AC
  Filled 2018-02-10: qty 100

## 2018-02-10 MED ORDER — GABAPENTIN 300 MG PO CAPS
300.0000 mg | ORAL_CAPSULE | ORAL | Status: AC
  Administered 2018-02-10: 300 mg via ORAL

## 2018-02-10 MED ORDER — EPHEDRINE SULFATE 50 MG/ML IJ SOLN
INTRAMUSCULAR | Status: DC | PRN
  Administered 2018-02-10: 10 mg via INTRAVENOUS

## 2018-02-10 MED ORDER — SCOPOLAMINE 1 MG/3DAYS TD PT72: 1.0000 | MEDICATED_PATCH | Freq: Once | TRANSDERMAL | Status: DC | PRN

## 2018-02-10 MED ORDER — LIDOCAINE 2% (20 MG/ML) 5 ML SYRINGE
INTRAMUSCULAR | Status: AC
  Filled 2018-02-10: qty 5

## 2018-02-10 MED ORDER — DEXAMETHASONE SODIUM PHOSPHATE 4 MG/ML IJ SOLN
INTRAMUSCULAR | Status: DC | PRN
  Administered 2018-02-10: 10 mg via INTRAVENOUS

## 2018-02-10 MED ORDER — FENTANYL CITRATE (PF) 100 MCG/2ML IJ SOLN
INTRAMUSCULAR | Status: AC
  Filled 2018-02-10: qty 2

## 2018-02-10 MED ORDER — ACETAMINOPHEN 500 MG PO TABS
ORAL_TABLET | ORAL | Status: AC
  Filled 2018-02-10: qty 2

## 2018-02-10 MED ORDER — PROPOFOL 10 MG/ML IV BOLUS
INTRAVENOUS | Status: AC
  Filled 2018-02-10: qty 20

## 2018-02-10 MED ORDER — ONDANSETRON HCL 4 MG/2ML IJ SOLN
INTRAMUSCULAR | Status: AC
  Filled 2018-02-10: qty 2

## 2018-02-10 SURGICAL SUPPLY — 53 items
ADH SKN CLS APL DERMABOND .7 (GAUZE/BANDAGES/DRESSINGS) ×1
BANDAGE ACE 4X5 VEL STRL LF (GAUZE/BANDAGES/DRESSINGS) ×1 IMPLANT
BLADE HEX COATED 2.75 (ELECTRODE) ×2 IMPLANT
BLADE SURG 10 STRL SS (BLADE) ×2 IMPLANT
BLADE SURG 15 STRL LF DISP TIS (BLADE) ×1 IMPLANT
BLADE SURG 15 STRL SS (BLADE) ×2
BNDG COHESIVE 4X5 TAN STRL (GAUZE/BANDAGES/DRESSINGS) ×1 IMPLANT
CANISTER SUCT 1200ML W/VALVE (MISCELLANEOUS) IMPLANT
CHLORAPREP W/TINT 26ML (MISCELLANEOUS) ×2 IMPLANT
COVER BACK TABLE 60X90IN (DRAPES) ×1 IMPLANT
COVER MAYO STAND STRL (DRAPES) ×2 IMPLANT
DECANTER SPIKE VIAL GLASS SM (MISCELLANEOUS) IMPLANT
DERMABOND ADVANCED (GAUZE/BANDAGES/DRESSINGS) ×1
DERMABOND ADVANCED .7 DNX12 (GAUZE/BANDAGES/DRESSINGS) ×1 IMPLANT
DRAPE EXTREMITY T 121X128X90 (DRAPE) ×1 IMPLANT
DRAPE UTILITY XL STRL (DRAPES) ×2 IMPLANT
DRSG PAD ABDOMINAL 8X10 ST (GAUZE/BANDAGES/DRESSINGS) IMPLANT
ELECT REM PT RETURN 9FT ADLT (ELECTROSURGICAL) ×2
ELECTRODE REM PT RTRN 9FT ADLT (ELECTROSURGICAL) ×1 IMPLANT
GAUZE SPONGE 4X4 12PLY STRL (GAUZE/BANDAGES/DRESSINGS) ×1 IMPLANT
GAUZE SPONGE 4X4 12PLY STRL LF (GAUZE/BANDAGES/DRESSINGS) ×1 IMPLANT
GLOVE BIO SURGEON STRL SZ 6 (GLOVE) ×2 IMPLANT
GLOVE BIO SURGEON STRL SZ7 (GLOVE) ×1 IMPLANT
GLOVE BIOGEL PI IND STRL 6.5 (GLOVE) ×1 IMPLANT
GLOVE BIOGEL PI IND STRL 7.0 (GLOVE) IMPLANT
GLOVE BIOGEL PI INDICATOR 6.5 (GLOVE) ×1
GLOVE BIOGEL PI INDICATOR 7.0 (GLOVE) ×1
GOWN STRL REUS W/ TWL LRG LVL3 (GOWN DISPOSABLE) ×1 IMPLANT
GOWN STRL REUS W/TWL 2XL LVL3 (GOWN DISPOSABLE) ×2 IMPLANT
GOWN STRL REUS W/TWL LRG LVL3 (GOWN DISPOSABLE) ×2
NDL HYPO 25X1 1.5 SAFETY (NEEDLE) ×1 IMPLANT
NEEDLE HYPO 25X1 1.5 SAFETY (NEEDLE) ×2 IMPLANT
NS IRRIG 1000ML POUR BTL (IV SOLUTION) IMPLANT
PACK BASIN DAY SURGERY FS (CUSTOM PROCEDURE TRAY) ×2 IMPLANT
PENCIL BUTTON HOLSTER BLD 10FT (ELECTRODE) ×2 IMPLANT
SLEEVE SCD COMPRESS KNEE MED (MISCELLANEOUS) ×1 IMPLANT
SPONGE LAP 18X18 RF (DISPOSABLE) ×2 IMPLANT
STAPLER VISISTAT 35W (STAPLE) IMPLANT
STOCKINETTE IMPERVIOUS LG (DRAPES) ×1 IMPLANT
STRIP CLOSURE SKIN 1/2X4 (GAUZE/BANDAGES/DRESSINGS) ×1 IMPLANT
SUT MNCRL AB 4-0 PS2 18 (SUTURE) ×2 IMPLANT
SUT SILK 3 0 TIES 17X18 (SUTURE)
SUT SILK 3-0 18XBRD TIE BLK (SUTURE) IMPLANT
SUT VIC AB 2-0 SH 27 (SUTURE)
SUT VIC AB 2-0 SH 27XBRD (SUTURE) IMPLANT
SUT VIC AB 3-0 SH 27 (SUTURE) ×2
SUT VIC AB 3-0 SH 27X BRD (SUTURE) ×1 IMPLANT
SUT VICRYL AB 3 0 TIES (SUTURE) ×1 IMPLANT
SYR BULB 3OZ (MISCELLANEOUS) ×2 IMPLANT
SYR CONTROL 10ML LL (SYRINGE) ×2 IMPLANT
TOWEL GREEN STERILE FF (TOWEL DISPOSABLE) ×2 IMPLANT
TUBE CONNECTING 20X1/4 (TUBING) IMPLANT
YANKAUER SUCT BULB TIP NO VENT (SUCTIONS) IMPLANT

## 2018-02-10 NOTE — Discharge Instructions (Addendum)
[facility] °Office Phone Number [phone] ° ° POST OP INSTRUCTIONS ° °Always review your discharge instruction sheet given to you by the facility where your surgery was performed. ° °IF YOU HAVE DISABILITY OR FAMILY LEAVE FORMS, YOU MUST BRING THEM TO THE OFFICE FOR PROCESSING.  DO NOT GIVE THEM TO YOUR DOCTOR. ° °1. A prescription for pain medication may be given to you upon discharge.  Take your pain medication as prescribed, if needed.  If narcotic pain medicine is not needed, then you may take acetaminophen (Tylenol) or ibuprofen (Advil) as needed. °2. Take your usually prescribed medications unless otherwise directed °3. If you need a refill on your pain medication, please contact your pharmacy.  They will contact our office to request authorization.  Prescriptions will not be filled after 5pm or on week-ends. °4. You should eat very light the first 24 hours after surgery, such as soup, crackers, pudding, etc.  Resume your normal diet the day after surgery °5. It is common to experience some constipation if taking pain medication after surgery.  Increasing fluid intake and taking a stool softener will usually help or prevent this problem from occurring.  A mild laxative (Milk of Magnesia or Miralax) should be taken according to package directions if there are no bowel movements after 48 hours. °6. You may shower in 48 hours.  The surgical glue will flake off in 2-3 weeks.   °7. ACTIVITIES:  No strenuous activity or heavy lifting for 1 week.   °a. You may drive when you no longer are taking prescription pain medication, you can comfortably wear a seatbelt, and you can safely maneuver your car and apply brakes. °b. RETURN TO WORK:  __________1 week_______________ °You should see your doctor in the office for a follow-up appointment approximately three-four weeks after your surgery.   ° °WHEN TO CALL YOUR DOCTOR: °1. Fever over 101.0 °2. Nausea and/or vomiting. °3. Extreme swelling or  bruising. °4. Continued bleeding from incision. °5. Increased pain, redness, or drainage from the incision. ° °The clinic staff is available to answer your questions during regular business hours.  Please don’t hesitate to call and ask to speak to one of the nurses for clinical concerns.  If you have a medical emergency, go to the nearest emergency room or call 911.  A surgeon from [facility] is always on call at the hospital. ° °For further questions, please visit [email or web address]  ° ° ° °Post Anesthesia Home Care Instructions ° °Activity: °Get plenty of rest for the remainder of the day. A responsible individual must stay with you for 24 hours following the procedure.  °For the next 24 hours, DO NOT: °-Drive a car °-Operate machinery °-Drink alcoholic beverages °-Take any medication unless instructed by your physician °-Make any legal decisions or sign important papers. ° °Meals: °Start with liquid foods such as gelatin or soup. Progress to regular foods as tolerated. Avoid greasy, spicy, heavy foods. If nausea and/or vomiting occur, drink only clear liquids until the nausea and/or vomiting subsides. Call your physician if vomiting continues. ° °Special Instructions/Symptoms: °Your throat may feel dry or sore from the anesthesia or the breathing tube placed in your throat during surgery. If this causes discomfort, gargle with warm salt water. The discomfort should disappear within 24 hours. ° °If you had a scopolamine patch placed behind your ear for the management of post- operative nausea and/or vomiting: ° °1. The medication in the patch is effective for 72 hours, after which it   removed.  Wrap patch in a tissue and discard in the trash. Wash hands thoroughly with soap and water. °2. You may remove the patch earlier than 72 hours if you experience unpleasant side effects which may include dry mouth, dizziness or visual disturbances. °3. Avoid touching the patch. Wash your hands with  soap and water after contact with the patch. °  ° °

## 2018-02-10 NOTE — Anesthesia Preprocedure Evaluation (Signed)
Anesthesia Evaluation  Patient identified by MRN, date of birth, ID band Patient awake    Reviewed: Allergy & Precautions, NPO status , Patient's Chart, lab work & pertinent test results  Airway Mallampati: II  TM Distance: >3 FB Neck ROM: Full    Dental no notable dental hx.    Pulmonary neg pulmonary ROS,    Pulmonary exam normal breath sounds clear to auscultation       Cardiovascular negative cardio ROS Normal cardiovascular exam Rhythm:Regular Rate:Normal     Neuro/Psych negative neurological ROS  negative psych ROS   GI/Hepatic Neg liver ROS, GERD  Medicated and Controlled,  Endo/Other  negative endocrine ROS  Renal/GU negative Renal ROS  negative genitourinary   Musculoskeletal negative musculoskeletal ROS (+)   Abdominal   Peds negative pediatric ROS (+)  Hematology negative hematology ROS (+)   Anesthesia Other Findings   Reproductive/Obstetrics negative OB ROS                             Anesthesia Physical Anesthesia Plan  ASA: II  Anesthesia Plan: General   Post-op Pain Management:    Induction: Intravenous  PONV Risk Score and Plan: 3 and Ondansetron and Treatment may vary due to age or medical condition  Airway Management Planned: LMA  Additional Equipment:   Intra-op Plan:   Post-operative Plan: Extubation in OR  Informed Consent: I have reviewed the patients History and Physical, chart, labs and discussed the procedure including the risks, benefits and alternatives for the proposed anesthesia with the patient or authorized representative who has indicated his/her understanding and acceptance.   Dental advisory given  Plan Discussed with: CRNA  Anesthesia Plan Comments:         Anesthesia Quick Evaluation

## 2018-02-10 NOTE — Transfer of Care (Signed)
Immediate Anesthesia Transfer of Care Note  Patient: Julie Woodward  Procedure(s) Performed: EXCISION OF RIGHT UPPER ARM MASS (Right Arm Upper)  Patient Location: PACU  Anesthesia Type:General  Level of Consciousness: sedated  Airway & Oxygen Therapy: Patient Spontanous Breathing and Patient connected to face mask oxygen  Post-op Assessment: Report given to RN and Post -op Vital signs reviewed and stable  Post vital signs: Reviewed and stable  Last Vitals:  Vitals Value Taken Time  BP 110/66 02/10/2018  2:30 PM  Temp    Pulse 92 02/10/2018  2:31 PM  Resp 13 02/10/2018  2:31 PM  SpO2 100 % 02/10/2018  2:31 PM    Last Pain:  Vitals:   02/10/18 1212  TempSrc: Oral  PainSc: 0-No pain         Complications: No apparent anesthesia complications

## 2018-02-10 NOTE — Op Note (Signed)
PRE-OPERATIVE DIAGNOSIS: right upper arm mass  POST-OPERATIVE DIAGNOSIS:  Same  PROCEDURE:  Procedure(s): Excision of right upper arm mass, 4 cm, partially subcutaneous and partially subfascial.    SURGEON:  Surgeon(s): [name], MD  ANESTHESIA:   local and general  DRAINS: none   LOCAL MEDICATIONS USED:  BUPIVICAINE  and LIDOCAINE   SPECIMEN:  Source of Specimen:  right arm mass  DISPOSITION OF SPECIMEN:  PATHOLOGY  COUNTS:  YES  DICTATION: .Dragon Dictation  PLAN OF CARE: Discharge to home after PACU  PATIENT DISPOSITION:  PACU - hemodynamically stable.  FINDINGS:  Fatty mass consistent with lipoma.  Some portions extended into deltoid musculature  EBL: min  PROCEDURE:  Patient was identified in the holding area, taken to the operating room and placed supine on the OR table.  General anesthesia was induced.  Her right arm was prepped and draped in sterile fashion. A timeout was performed according to the surgical safety checklist.  When all was correct, we continued.    Local anesthetic was infiltrated just over the mass and the overlying skin.  A longitudinal incision was made with a #10 blade.  Skin hooks were used to assist with creation of skin flaps over the mass.  Once the mass was exposed, goulet retractors were used to assist with the dissection.    The mass had a vein overlying it which was clamped, divided, and tied off.  The mass extended through the fascia of the deltoid.  This had to be opened to allow it to pop out.  The cautery was used for the dissection.  Once the mass was dissected away from the surrounding tissue, the cavity was irrigated.  Hemostasis was achieved with cautery.    The skin was reapproximated with interrupted 3-0 vicryl deep dermal sutures and 4-0 monocryl running subcuticular suture.  The skin was then cleaned, dried, and dressed with dermabond.  Due to the empty space where the mass was, the upper arm was wrapped with an ace wrap.     The patient was allowed to emerge from anesthesia and was taken to the PACU in stable condition.  Needle, sponge, and instrument counts were correct x 2.

## 2018-02-10 NOTE — Interval H&P Note (Signed)
History and Physical Interval Note:  02/10/2018 12:31 PM  Julie Woodward  has presented today for surgery, with the diagnosis of RIGHT UPPER ARM MASS  The various methods of treatment have been discussed with the patient and family. After consideration of risks, benefits and other options for treatment, the patient has consented to  Procedure(s): EXCISION OF RIGHT UPPER ARM MASS (Right) as a surgical intervention .  The patient's history has been reviewed, patient examined, no change in status, stable for surgery.  I have reviewed the patient's chart and labs.  Questions were answered to the patient's satisfaction.     [name]

## 2018-02-10 NOTE — Anesthesia Procedure Notes (Signed)
Procedure Name: LMA Insertion Date/Time: 02/10/2018 1:42 PM Performed by: Maryella Shivers, CRNA Pre-anesthesia Checklist: Patient identified, Emergency Drugs available, Suction available and Patient being monitored Patient Re-evaluated:Patient Re-evaluated prior to induction Oxygen Delivery Method: Circle system utilized Preoxygenation: Pre-oxygenation with 100% oxygen Induction Type: IV induction Ventilation: Mask ventilation without difficulty LMA: LMA inserted LMA Size: 4.0 Number of attempts: 1 Airway Equipment and Method: Bite block Placement Confirmation: positive ETCO2 Tube secured with: Tape Dental Injury: Teeth and Oropharynx as per pre-operative assessment

## 2018-02-10 NOTE — Anesthesia Postprocedure Evaluation (Signed)
Anesthesia Post Note  Patient: JANIRA MANDELL  Procedure(s) Performed: EXCISION OF RIGHT UPPER ARM MASS (Right Arm Upper)     Patient location during evaluation: PACU Anesthesia Type: General Level of consciousness: awake and alert Pain management: pain level controlled Vital Signs Assessment: post-procedure vital signs reviewed and stable Respiratory status: spontaneous breathing, nonlabored ventilation, respiratory function stable and patient connected to nasal cannula oxygen Cardiovascular status: blood pressure returned to baseline and stable Postop Assessment: no apparent nausea or vomiting Anesthetic complications: no    Last Vitals:  Vitals:   02/10/18 1445 02/10/18 1504  BP: 118/72 124/77  Pulse: 90 81  Resp: 11 18  Temp:  36.8 C  SpO2: 95% 96%    Last Pain:  Vitals:   02/10/18 1504  TempSrc:   PainSc: 0-No pain                 Montez Hageman

## 2018-02-11 ENCOUNTER — Encounter (HOSPITAL_BASED_OUTPATIENT_CLINIC_OR_DEPARTMENT_OTHER): Payer: Self-pay | Admitting: General Surgery

## 2018-02-15 NOTE — Progress Notes (Signed)
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

## 2018-02-22 DIAGNOSIS — M79671 Pain in right foot: Secondary | ICD-10-CM | POA: Diagnosis not present

## 2018-03-01 DIAGNOSIS — Z1231 Encounter for screening mammogram for malignant neoplasm of breast: Secondary | ICD-10-CM | POA: Diagnosis not present

## 2018-03-22 DIAGNOSIS — M79672 Pain in left foot: Secondary | ICD-10-CM | POA: Diagnosis not present

## 2018-03-22 DIAGNOSIS — M722 Plantar fascial fibromatosis: Secondary | ICD-10-CM | POA: Diagnosis not present

## 2018-03-22 DIAGNOSIS — M79671 Pain in right foot: Secondary | ICD-10-CM | POA: Diagnosis not present

## 2018-03-31 DIAGNOSIS — M79671 Pain in right foot: Secondary | ICD-10-CM | POA: Diagnosis not present

## 2018-03-31 DIAGNOSIS — M79672 Pain in left foot: Secondary | ICD-10-CM | POA: Diagnosis not present

## 2018-04-05 DIAGNOSIS — M722 Plantar fascial fibromatosis: Secondary | ICD-10-CM | POA: Diagnosis not present

## 2018-04-07 ENCOUNTER — Other Ambulatory Visit (HOSPITAL_COMMUNITY): Payer: Self-pay | Admitting: Orthopedic Surgery

## 2018-04-12 DIAGNOSIS — M9914 Subluxation complex (vertebral) of sacral region: Secondary | ICD-10-CM | POA: Diagnosis not present

## 2018-04-12 DIAGNOSIS — M9905 Segmental and somatic dysfunction of pelvic region: Secondary | ICD-10-CM | POA: Diagnosis not present

## 2018-04-12 DIAGNOSIS — Q72812 Congenital shortening of left lower limb: Secondary | ICD-10-CM | POA: Diagnosis not present

## 2018-04-13 DIAGNOSIS — M9905 Segmental and somatic dysfunction of pelvic region: Secondary | ICD-10-CM | POA: Diagnosis not present

## 2018-04-13 DIAGNOSIS — Q72812 Congenital shortening of left lower limb: Secondary | ICD-10-CM | POA: Diagnosis not present

## 2018-04-13 DIAGNOSIS — M9914 Subluxation complex (vertebral) of sacral region: Secondary | ICD-10-CM | POA: Diagnosis not present

## 2018-04-15 DIAGNOSIS — M9914 Subluxation complex (vertebral) of sacral region: Secondary | ICD-10-CM | POA: Diagnosis not present

## 2018-04-15 DIAGNOSIS — M9905 Segmental and somatic dysfunction of pelvic region: Secondary | ICD-10-CM | POA: Diagnosis not present

## 2018-04-15 DIAGNOSIS — Q72812 Congenital shortening of left lower limb: Secondary | ICD-10-CM | POA: Diagnosis not present

## 2018-04-19 DIAGNOSIS — M9914 Subluxation complex (vertebral) of sacral region: Secondary | ICD-10-CM | POA: Diagnosis not present

## 2018-04-19 DIAGNOSIS — M9905 Segmental and somatic dysfunction of pelvic region: Secondary | ICD-10-CM | POA: Diagnosis not present

## 2018-04-19 DIAGNOSIS — Q72812 Congenital shortening of left lower limb: Secondary | ICD-10-CM | POA: Diagnosis not present

## 2018-04-20 DIAGNOSIS — Q72812 Congenital shortening of left lower limb: Secondary | ICD-10-CM | POA: Diagnosis not present

## 2018-04-20 DIAGNOSIS — M9905 Segmental and somatic dysfunction of pelvic region: Secondary | ICD-10-CM | POA: Diagnosis not present

## 2018-04-20 DIAGNOSIS — M9914 Subluxation complex (vertebral) of sacral region: Secondary | ICD-10-CM | POA: Diagnosis not present

## 2018-04-22 DIAGNOSIS — M9914 Subluxation complex (vertebral) of sacral region: Secondary | ICD-10-CM | POA: Diagnosis not present

## 2018-04-22 DIAGNOSIS — Q72812 Congenital shortening of left lower limb: Secondary | ICD-10-CM | POA: Diagnosis not present

## 2018-04-22 DIAGNOSIS — M9905 Segmental and somatic dysfunction of pelvic region: Secondary | ICD-10-CM | POA: Diagnosis not present

## 2018-04-26 DIAGNOSIS — M9905 Segmental and somatic dysfunction of pelvic region: Secondary | ICD-10-CM | POA: Diagnosis not present

## 2018-04-26 DIAGNOSIS — Q72812 Congenital shortening of left lower limb: Secondary | ICD-10-CM | POA: Diagnosis not present

## 2018-04-26 DIAGNOSIS — M9914 Subluxation complex (vertebral) of sacral region: Secondary | ICD-10-CM | POA: Diagnosis not present

## 2018-05-03 DIAGNOSIS — Q72812 Congenital shortening of left lower limb: Secondary | ICD-10-CM | POA: Diagnosis not present

## 2018-05-03 DIAGNOSIS — M9905 Segmental and somatic dysfunction of pelvic region: Secondary | ICD-10-CM | POA: Diagnosis not present

## 2018-05-03 DIAGNOSIS — M9914 Subluxation complex (vertebral) of sacral region: Secondary | ICD-10-CM | POA: Diagnosis not present

## 2018-05-04 DIAGNOSIS — M9914 Subluxation complex (vertebral) of sacral region: Secondary | ICD-10-CM | POA: Diagnosis not present

## 2018-05-04 DIAGNOSIS — M5386 Other specified dorsopathies, lumbar region: Secondary | ICD-10-CM | POA: Diagnosis not present

## 2018-05-04 DIAGNOSIS — Q72812 Congenital shortening of left lower limb: Secondary | ICD-10-CM | POA: Diagnosis not present

## 2018-05-04 DIAGNOSIS — M9903 Segmental and somatic dysfunction of lumbar region: Secondary | ICD-10-CM | POA: Diagnosis not present

## 2018-05-04 DIAGNOSIS — M9905 Segmental and somatic dysfunction of pelvic region: Secondary | ICD-10-CM | POA: Diagnosis not present

## 2018-05-06 ENCOUNTER — Ambulatory Visit (HOSPITAL_BASED_OUTPATIENT_CLINIC_OR_DEPARTMENT_OTHER): Admit: 2018-05-06 | Payer: Medicare HMO | Admitting: Orthopedic Surgery

## 2018-05-06 ENCOUNTER — Encounter (HOSPITAL_BASED_OUTPATIENT_CLINIC_OR_DEPARTMENT_OTHER): Payer: Self-pay

## 2018-05-06 DIAGNOSIS — M9914 Subluxation complex (vertebral) of sacral region: Secondary | ICD-10-CM | POA: Diagnosis not present

## 2018-05-06 DIAGNOSIS — M5386 Other specified dorsopathies, lumbar region: Secondary | ICD-10-CM | POA: Diagnosis not present

## 2018-05-06 DIAGNOSIS — Q72812 Congenital shortening of left lower limb: Secondary | ICD-10-CM | POA: Diagnosis not present

## 2018-05-06 DIAGNOSIS — M9903 Segmental and somatic dysfunction of lumbar region: Secondary | ICD-10-CM | POA: Diagnosis not present

## 2018-05-06 DIAGNOSIS — M9905 Segmental and somatic dysfunction of pelvic region: Secondary | ICD-10-CM | POA: Diagnosis not present

## 2018-05-06 SURGERY — RELEASE, FASCIA, PLANTAR
Anesthesia: General | Laterality: Left

## 2018-05-10 DIAGNOSIS — Q72812 Congenital shortening of left lower limb: Secondary | ICD-10-CM | POA: Diagnosis not present

## 2018-05-10 DIAGNOSIS — M9903 Segmental and somatic dysfunction of lumbar region: Secondary | ICD-10-CM | POA: Diagnosis not present

## 2018-05-10 DIAGNOSIS — M5386 Other specified dorsopathies, lumbar region: Secondary | ICD-10-CM | POA: Diagnosis not present

## 2018-05-10 DIAGNOSIS — M9905 Segmental and somatic dysfunction of pelvic region: Secondary | ICD-10-CM | POA: Diagnosis not present

## 2018-05-11 DIAGNOSIS — M5386 Other specified dorsopathies, lumbar region: Secondary | ICD-10-CM | POA: Diagnosis not present

## 2018-05-11 DIAGNOSIS — M9903 Segmental and somatic dysfunction of lumbar region: Secondary | ICD-10-CM | POA: Diagnosis not present

## 2018-05-11 DIAGNOSIS — M9905 Segmental and somatic dysfunction of pelvic region: Secondary | ICD-10-CM | POA: Diagnosis not present

## 2018-05-11 DIAGNOSIS — Q72812 Congenital shortening of left lower limb: Secondary | ICD-10-CM | POA: Diagnosis not present

## 2018-05-17 DIAGNOSIS — M5386 Other specified dorsopathies, lumbar region: Secondary | ICD-10-CM | POA: Diagnosis not present

## 2018-05-17 DIAGNOSIS — M9903 Segmental and somatic dysfunction of lumbar region: Secondary | ICD-10-CM | POA: Diagnosis not present

## 2018-05-17 DIAGNOSIS — M9905 Segmental and somatic dysfunction of pelvic region: Secondary | ICD-10-CM | POA: Diagnosis not present

## 2018-05-17 DIAGNOSIS — Q72812 Congenital shortening of left lower limb: Secondary | ICD-10-CM | POA: Diagnosis not present

## 2018-05-19 DIAGNOSIS — M5386 Other specified dorsopathies, lumbar region: Secondary | ICD-10-CM | POA: Diagnosis not present

## 2018-05-19 DIAGNOSIS — M9905 Segmental and somatic dysfunction of pelvic region: Secondary | ICD-10-CM | POA: Diagnosis not present

## 2018-05-19 DIAGNOSIS — M9903 Segmental and somatic dysfunction of lumbar region: Secondary | ICD-10-CM | POA: Diagnosis not present

## 2018-05-19 DIAGNOSIS — Q72812 Congenital shortening of left lower limb: Secondary | ICD-10-CM | POA: Diagnosis not present

## 2018-05-21 DIAGNOSIS — H5203 Hypermetropia, bilateral: Secondary | ICD-10-CM | POA: Diagnosis not present

## 2018-05-24 DIAGNOSIS — M5386 Other specified dorsopathies, lumbar region: Secondary | ICD-10-CM | POA: Diagnosis not present

## 2018-05-24 DIAGNOSIS — M9903 Segmental and somatic dysfunction of lumbar region: Secondary | ICD-10-CM | POA: Diagnosis not present

## 2018-05-24 DIAGNOSIS — Q72812 Congenital shortening of left lower limb: Secondary | ICD-10-CM | POA: Diagnosis not present

## 2018-05-24 DIAGNOSIS — M9905 Segmental and somatic dysfunction of pelvic region: Secondary | ICD-10-CM | POA: Diagnosis not present

## 2018-07-06 DIAGNOSIS — D2272 Melanocytic nevi of left lower limb, including hip: Secondary | ICD-10-CM | POA: Diagnosis not present

## 2018-07-06 DIAGNOSIS — L918 Other hypertrophic disorders of the skin: Secondary | ICD-10-CM | POA: Diagnosis not present

## 2018-07-06 DIAGNOSIS — I788 Other diseases of capillaries: Secondary | ICD-10-CM | POA: Diagnosis not present

## 2018-07-06 DIAGNOSIS — D485 Neoplasm of uncertain behavior of skin: Secondary | ICD-10-CM | POA: Diagnosis not present

## 2018-07-06 DIAGNOSIS — L430 Hypertrophic lichen planus: Secondary | ICD-10-CM | POA: Diagnosis not present

## 2018-07-06 DIAGNOSIS — D1801 Hemangioma of skin and subcutaneous tissue: Secondary | ICD-10-CM | POA: Diagnosis not present

## 2018-07-06 DIAGNOSIS — L570 Actinic keratosis: Secondary | ICD-10-CM | POA: Diagnosis not present

## 2018-07-06 DIAGNOSIS — L814 Other melanin hyperpigmentation: Secondary | ICD-10-CM | POA: Diagnosis not present

## 2018-07-06 DIAGNOSIS — L820 Inflamed seborrheic keratosis: Secondary | ICD-10-CM | POA: Diagnosis not present

## 2018-07-06 DIAGNOSIS — L578 Other skin changes due to chronic exposure to nonionizing radiation: Secondary | ICD-10-CM | POA: Diagnosis not present

## 2018-07-06 DIAGNOSIS — Z85828 Personal history of other malignant neoplasm of skin: Secondary | ICD-10-CM | POA: Diagnosis not present

## 2018-07-27 DIAGNOSIS — Z0100 Encounter for examination of eyes and vision without abnormal findings: Secondary | ICD-10-CM | POA: Diagnosis not present

## 2018-07-28 DIAGNOSIS — Z0100 Encounter for examination of eyes and vision without abnormal findings: Secondary | ICD-10-CM | POA: Diagnosis not present

## 2019-01-26 DIAGNOSIS — Z1211 Encounter for screening for malignant neoplasm of colon: Secondary | ICD-10-CM | POA: Diagnosis not present

## 2019-01-26 DIAGNOSIS — E7800 Pure hypercholesterolemia, unspecified: Secondary | ICD-10-CM | POA: Diagnosis not present

## 2019-01-26 DIAGNOSIS — Z23 Encounter for immunization: Secondary | ICD-10-CM | POA: Diagnosis not present

## 2019-01-26 DIAGNOSIS — R030 Elevated blood-pressure reading, without diagnosis of hypertension: Secondary | ICD-10-CM | POA: Diagnosis not present

## 2019-01-26 DIAGNOSIS — T7840XA Allergy, unspecified, initial encounter: Secondary | ICD-10-CM | POA: Diagnosis not present

## 2019-01-26 DIAGNOSIS — Z0000 Encounter for general adult medical examination without abnormal findings: Secondary | ICD-10-CM | POA: Diagnosis not present

## 2019-01-26 DIAGNOSIS — Z1389 Encounter for screening for other disorder: Secondary | ICD-10-CM | POA: Diagnosis not present

## 2019-03-03 DIAGNOSIS — Z1231 Encounter for screening mammogram for malignant neoplasm of breast: Secondary | ICD-10-CM | POA: Diagnosis not present

## 2019-03-21 DIAGNOSIS — Z1159 Encounter for screening for other viral diseases: Secondary | ICD-10-CM | POA: Diagnosis not present

## 2019-03-24 DIAGNOSIS — Z86010 Personal history of colonic polyps: Secondary | ICD-10-CM | POA: Diagnosis not present

## 2019-03-24 DIAGNOSIS — K5730 Diverticulosis of large intestine without perforation or abscess without bleeding: Secondary | ICD-10-CM | POA: Diagnosis not present

## 2019-03-24 DIAGNOSIS — D123 Benign neoplasm of transverse colon: Secondary | ICD-10-CM | POA: Diagnosis not present

## 2019-03-28 DIAGNOSIS — E7800 Pure hypercholesterolemia, unspecified: Secondary | ICD-10-CM | POA: Diagnosis not present

## 2019-03-29 DIAGNOSIS — D123 Benign neoplasm of transverse colon: Secondary | ICD-10-CM | POA: Diagnosis not present

## 2019-07-26 DIAGNOSIS — L821 Other seborrheic keratosis: Secondary | ICD-10-CM | POA: Diagnosis not present

## 2019-07-26 DIAGNOSIS — L218 Other seborrheic dermatitis: Secondary | ICD-10-CM | POA: Diagnosis not present

## 2019-07-26 DIAGNOSIS — D2262 Melanocytic nevi of left upper limb, including shoulder: Secondary | ICD-10-CM | POA: Diagnosis not present

## 2019-07-26 DIAGNOSIS — D1801 Hemangioma of skin and subcutaneous tissue: Secondary | ICD-10-CM | POA: Diagnosis not present

## 2019-07-26 DIAGNOSIS — D225 Melanocytic nevi of trunk: Secondary | ICD-10-CM | POA: Diagnosis not present

## 2019-07-26 DIAGNOSIS — L570 Actinic keratosis: Secondary | ICD-10-CM | POA: Diagnosis not present

## 2019-07-26 DIAGNOSIS — Z85828 Personal history of other malignant neoplasm of skin: Secondary | ICD-10-CM | POA: Diagnosis not present

## 2019-07-26 DIAGNOSIS — D2272 Melanocytic nevi of left lower limb, including hip: Secondary | ICD-10-CM | POA: Diagnosis not present

## 2019-09-03 ENCOUNTER — Emergency Department (HOSPITAL_COMMUNITY): Payer: Medicare HMO

## 2019-09-03 ENCOUNTER — Emergency Department (HOSPITAL_COMMUNITY): Payer: Medicare HMO | Admitting: Certified Registered"

## 2019-09-03 ENCOUNTER — Encounter (HOSPITAL_COMMUNITY): Payer: Self-pay | Admitting: Emergency Medicine

## 2019-09-03 ENCOUNTER — Other Ambulatory Visit: Payer: Self-pay

## 2019-09-03 ENCOUNTER — Encounter (HOSPITAL_COMMUNITY)
Admission: EM | Disposition: A | Discharge status: Home / Self Care | Payer: Self-pay | Source: Home / Self Care | Attending: Emergency Medicine

## 2019-09-03 ENCOUNTER — Observation Stay (HOSPITAL_COMMUNITY)
Admission: EM | Admit: 2019-09-03 | Discharge: 2019-09-04 | Disposition: A | Discharge status: Home / Self Care | Payer: Medicare HMO | Attending: Orthopaedic Surgery | Admitting: Orthopaedic Surgery

## 2019-09-03 DIAGNOSIS — S52501A Unspecified fracture of the lower end of right radius, initial encounter for closed fracture: Secondary | ICD-10-CM | POA: Diagnosis not present

## 2019-09-03 DIAGNOSIS — Z23 Encounter for immunization: Secondary | ICD-10-CM | POA: Insufficient documentation

## 2019-09-03 DIAGNOSIS — K449 Diaphragmatic hernia without obstruction or gangrene: Secondary | ICD-10-CM | POA: Insufficient documentation

## 2019-09-03 DIAGNOSIS — E039 Hypothyroidism, unspecified: Secondary | ICD-10-CM | POA: Diagnosis not present

## 2019-09-03 DIAGNOSIS — K219 Gastro-esophageal reflux disease without esophagitis: Secondary | ICD-10-CM | POA: Diagnosis not present

## 2019-09-03 DIAGNOSIS — S52571A Other intraarticular fracture of lower end of right radius, initial encounter for closed fracture: Principal | ICD-10-CM | POA: Insufficient documentation

## 2019-09-03 DIAGNOSIS — S0990XA Unspecified injury of head, initial encounter: Secondary | ICD-10-CM | POA: Diagnosis not present

## 2019-09-03 DIAGNOSIS — M6281 Muscle weakness (generalized): Secondary | ICD-10-CM | POA: Diagnosis not present

## 2019-09-03 DIAGNOSIS — Y92410 Unspecified street and highway as the place of occurrence of the external cause: Secondary | ICD-10-CM | POA: Insufficient documentation

## 2019-09-03 DIAGNOSIS — Z79899 Other long term (current) drug therapy: Secondary | ICD-10-CM | POA: Insufficient documentation

## 2019-09-03 DIAGNOSIS — S2220XA Unspecified fracture of sternum, initial encounter for closed fracture: Secondary | ICD-10-CM

## 2019-09-03 DIAGNOSIS — S199XXA Unspecified injury of neck, initial encounter: Secondary | ICD-10-CM | POA: Diagnosis not present

## 2019-09-03 DIAGNOSIS — I959 Hypotension, unspecified: Secondary | ICD-10-CM | POA: Diagnosis not present

## 2019-09-03 DIAGNOSIS — S62101A Fracture of unspecified carpal bone, right wrist, initial encounter for closed fracture: Secondary | ICD-10-CM

## 2019-09-03 DIAGNOSIS — G8918 Other acute postprocedural pain: Secondary | ICD-10-CM | POA: Diagnosis not present

## 2019-09-03 DIAGNOSIS — S52601A Unspecified fracture of lower end of right ulna, initial encounter for closed fracture: Secondary | ICD-10-CM | POA: Diagnosis not present

## 2019-09-03 DIAGNOSIS — Z20822 Contact with and (suspected) exposure to covid-19: Secondary | ICD-10-CM | POA: Insufficient documentation

## 2019-09-03 DIAGNOSIS — Z791 Long term (current) use of non-steroidal anti-inflammatories (NSAID): Secondary | ICD-10-CM | POA: Insufficient documentation

## 2019-09-03 DIAGNOSIS — R52 Pain, unspecified: Secondary | ICD-10-CM | POA: Diagnosis not present

## 2019-09-03 DIAGNOSIS — Z982 Presence of cerebrospinal fluid drainage device: Secondary | ICD-10-CM | POA: Insufficient documentation

## 2019-09-03 DIAGNOSIS — S3993XA Unspecified injury of pelvis, initial encounter: Secondary | ICD-10-CM | POA: Diagnosis not present

## 2019-09-03 DIAGNOSIS — Z03818 Encounter for observation for suspected exposure to other biological agents ruled out: Secondary | ICD-10-CM | POA: Diagnosis not present

## 2019-09-03 DIAGNOSIS — S3991XA Unspecified injury of abdomen, initial encounter: Secondary | ICD-10-CM | POA: Diagnosis not present

## 2019-09-03 DIAGNOSIS — S299XXA Unspecified injury of thorax, initial encounter: Secondary | ICD-10-CM | POA: Diagnosis not present

## 2019-09-03 DIAGNOSIS — S6291XA Unspecified fracture of right wrist and hand, initial encounter for closed fracture: Secondary | ICD-10-CM | POA: Diagnosis not present

## 2019-09-03 HISTORY — PX: ORIF RADIAL FRACTURE: SHX5113

## 2019-09-03 LAB — PROTIME-INR
INR: 1 (ref 0.8–1.2)
Prothrombin Time: 12.6 seconds (ref 11.4–15.2)

## 2019-09-03 LAB — COMPREHENSIVE METABOLIC PANEL
ALT: 38 U/L (ref 0–44)
AST: 39 U/L (ref 15–41)
Albumin: 4.2 g/dL (ref 3.5–5.0)
Alkaline Phosphatase: 90 U/L (ref 38–126)
Anion gap: 14 (ref 5–15)
BUN: 11 mg/dL (ref 8–23)
CO2: 24 mmol/L (ref 22–32)
Calcium: 9.3 mg/dL (ref 8.9–10.3)
Chloride: 102 mmol/L (ref 98–111)
Creatinine, Ser: 0.81 mg/dL (ref 0.44–1.00)
GFR calc Af Amer: >60 mL/min (ref >60)
GFR calc non Af Amer: >60 mL/min (ref >60)
Glucose, Bld: 126 mg/dL — ABNORMAL HIGH (ref 70–99)
Potassium: 4 mmol/L (ref 3.5–5.1)
Sodium: 140 mmol/L (ref 135–145)
Total Bilirubin: 0.8 mg/dL (ref 0.3–1.2)
Total Protein: 7.4 g/dL (ref 6.5–8.1)

## 2019-09-03 LAB — I-STAT CHEM 8, ED
BUN: 12 mg/dL (ref 8–23)
Calcium, Ion: 1.13 mmol/L — ABNORMAL LOW (ref 1.15–1.40)
Chloride: 103 mmol/L (ref 98–111)
Creatinine, Ser: 0.7 mg/dL (ref 0.44–1.00)
Glucose, Bld: 124 mg/dL — ABNORMAL HIGH (ref 70–99)
HCT: 44 % (ref 36.0–46.0)
Hemoglobin: 15 g/dL (ref 12.0–15.0)
Potassium: 3.8 mmol/L (ref 3.5–5.1)
Sodium: 138 mmol/L (ref 135–145)
TCO2: 29 mmol/L (ref 22–32)

## 2019-09-03 LAB — RESPIRATORY PANEL BY RT PCR (FLU A&B, COVID)
Influenza A by PCR: NEGATIVE
Influenza B by PCR: NEGATIVE
SARS Coronavirus 2 by RT PCR: NEGATIVE

## 2019-09-03 LAB — URINALYSIS, ROUTINE W REFLEX MICROSCOPIC
Bacteria, UA: NONE SEEN
Bilirubin Urine: NEGATIVE
Glucose, UA: NEGATIVE mg/dL
Hgb urine dipstick: NEGATIVE
Ketones, ur: 20 mg/dL — AB
Nitrite: NEGATIVE
Protein, ur: NEGATIVE mg/dL
Specific Gravity, Urine: 1.038 — ABNORMAL HIGH (ref 1.005–1.030)
pH: 7 (ref 5.0–8.0)

## 2019-09-03 LAB — CBC
HCT: 45.8 % (ref 36.0–46.0)
Hemoglobin: 14.9 g/dL (ref 12.0–15.0)
MCH: 30 pg (ref 26.0–34.0)
MCHC: 32.5 g/dL (ref 30.0–36.0)
MCV: 92.3 fL (ref 80.0–100.0)
Platelets: 244 10*3/uL (ref 150–400)
RBC: 4.96 MIL/uL (ref 3.87–5.11)
RDW: 13.2 % (ref 11.5–15.5)
WBC: 8.5 10*3/uL (ref 4.0–10.5)
nRBC: 0 % (ref 0.0–0.2)

## 2019-09-03 LAB — LACTIC ACID, PLASMA: Lactic Acid, Venous: 2.8 mmol/L (ref 0.5–1.9)

## 2019-09-03 LAB — ETHANOL: Alcohol, Ethyl (B): <10 mg/dL (ref <10)

## 2019-09-03 LAB — SAMPLE TO BLOOD BANK

## 2019-09-03 SURGERY — OPEN REDUCTION INTERNAL FIXATION (ORIF) RADIAL FRACTURE
Anesthesia: Monitor Anesthesia Care | Laterality: Right

## 2019-09-03 MED ORDER — DEXAMETHASONE SODIUM PHOSPHATE 4 MG/ML IJ SOLN
INTRAMUSCULAR | Status: DC | PRN
  Administered 2019-09-03: 5 mg via INTRAVENOUS

## 2019-09-03 MED ORDER — LACTATED RINGERS IV BOLUS
1000.0000 mL | Freq: Once | INTRAVENOUS | Status: AC
  Administered 2019-09-03: 13:00:00 1000 mL via INTRAVENOUS

## 2019-09-03 MED ORDER — ONDANSETRON HCL 4 MG/2ML IJ SOLN
INTRAMUSCULAR | Status: DC | PRN
  Administered 2019-09-03: 4 mg via INTRAVENOUS

## 2019-09-03 MED ORDER — IOHEXOL 300 MG/ML  SOLN
100.0000 mL | Freq: Once | INTRAMUSCULAR | Status: AC | PRN
  Administered 2019-09-03: 100 mL via INTRAVENOUS

## 2019-09-03 MED ORDER — METHOCARBAMOL 1000 MG/10ML IJ SOLN
500.0000 mg | Freq: Four times a day (QID) | INTRAVENOUS | Status: DC | PRN
  Filled 2019-09-03: qty 5

## 2019-09-03 MED ORDER — DOCUSATE SODIUM 100 MG PO CAPS
100.0000 mg | ORAL_CAPSULE | Freq: Two times a day (BID) | ORAL | Status: DC
  Administered 2019-09-03 – 2019-09-04 (×2): 100 mg via ORAL
  Filled 2019-09-03 (×2): qty 1

## 2019-09-03 MED ORDER — FENTANYL CITRATE (PF) 100 MCG/2ML IJ SOLN
50.0000 ug | Freq: Once | INTRAMUSCULAR | Status: AC
  Administered 2019-09-03: 13:00:00 50 ug via INTRAVENOUS
  Filled 2019-09-03: qty 2

## 2019-09-03 MED ORDER — FENTANYL CITRATE (PF) 100 MCG/2ML IJ SOLN: 25.0000 ug | INTRAMUSCULAR | Status: DC | PRN

## 2019-09-03 MED ORDER — ACETAMINOPHEN 325 MG PO TABS: 325.0000 mg | ORAL_TABLET | Freq: Four times a day (QID) | ORAL | Status: DC | PRN

## 2019-09-03 MED ORDER — ONDANSETRON HCL 4 MG/2ML IJ SOLN
4.0000 mg | Freq: Once | INTRAMUSCULAR | Status: AC
  Administered 2019-09-03: 4 mg via INTRAVENOUS
  Filled 2019-09-03: qty 2

## 2019-09-03 MED ORDER — HYDROCODONE-ACETAMINOPHEN 5-325 MG PO TABS: 1.0000 | ORAL_TABLET | ORAL | Status: DC | PRN

## 2019-09-03 MED ORDER — DIPHENHYDRAMINE HCL 12.5 MG/5ML PO ELIX: 12.5000 mg | ORAL_SOLUTION | ORAL | Status: DC | PRN

## 2019-09-03 MED ORDER — CEFAZOLIN SODIUM-DEXTROSE 2-4 GM/100ML-% IV SOLN
INTRAVENOUS | Status: AC
  Filled 2019-09-03: qty 100

## 2019-09-03 MED ORDER — HYDROMORPHONE HCL 1 MG/ML IJ SOLN: 0.5000 mg | INTRAMUSCULAR | Status: DC | PRN

## 2019-09-03 MED ORDER — FENTANYL CITRATE (PF) 250 MCG/5ML IJ SOLN
INTRAMUSCULAR | Status: DC | PRN
  Administered 2019-09-03: 50 ug via INTRAVENOUS
  Administered 2019-09-03: 25 ug via INTRAVENOUS
  Administered 2019-09-03: 50 ug via INTRAVENOUS
  Administered 2019-09-03 (×3): 25 ug via INTRAVENOUS
  Administered 2019-09-03: 50 ug via INTRAVENOUS

## 2019-09-03 MED ORDER — PHENYLEPHRINE HCL-NACL 10-0.9 MG/250ML-% IV SOLN
INTRAVENOUS | Status: DC | PRN
  Administered 2019-09-03: 10 ug/min via INTRAVENOUS

## 2019-09-03 MED ORDER — MIDAZOLAM HCL 2 MG/2ML IJ SOLN
INTRAMUSCULAR | Status: AC
  Filled 2019-09-03: qty 2

## 2019-09-03 MED ORDER — CEFAZOLIN SODIUM-DEXTROSE 2-4 GM/100ML-% IV SOLN
2.0000 g | Freq: Once | INTRAVENOUS | Status: AC
  Administered 2019-09-03: 2 g via INTRAVENOUS

## 2019-09-03 MED ORDER — ONDANSETRON HCL 4 MG PO TABS: 4.0000 mg | ORAL_TABLET | Freq: Four times a day (QID) | ORAL | Status: DC | PRN

## 2019-09-03 MED ORDER — ONDANSETRON HCL 4 MG/2ML IJ SOLN: 4.0000 mg | Freq: Once | INTRAMUSCULAR | Status: DC | PRN

## 2019-09-03 MED ORDER — HYDROMORPHONE HCL 1 MG/ML IJ SOLN
0.5000 mg | Freq: Once | INTRAMUSCULAR | Status: AC
  Administered 2019-09-03: 0.5 mg via INTRAVENOUS
  Filled 2019-09-03: qty 1

## 2019-09-03 MED ORDER — PROPOFOL 10 MG/ML IV BOLUS
INTRAVENOUS | Status: DC | PRN
  Administered 2019-09-03 (×2): 20 mg via INTRAVENOUS
  Administered 2019-09-03: 150 mg via INTRAVENOUS

## 2019-09-03 MED ORDER — ONDANSETRON HCL 4 MG/2ML IJ SOLN: 4.0000 mg | Freq: Four times a day (QID) | INTRAMUSCULAR | Status: DC | PRN

## 2019-09-03 MED ORDER — MIDAZOLAM HCL 2 MG/2ML IJ SOLN
INTRAMUSCULAR | Status: DC | PRN
  Administered 2019-09-03: 2 mg via INTRAVENOUS

## 2019-09-03 MED ORDER — PROPOFOL 500 MG/50ML IV EMUL
INTRAVENOUS | Status: DC | PRN
  Administered 2019-09-03: 50 ug/kg/min via INTRAVENOUS

## 2019-09-03 MED ORDER — TETANUS-DIPHTH-ACELL PERTUSSIS 5-2.5-18.5 LF-MCG/0.5 IM SUSP
0.5000 mL | Freq: Once | INTRAMUSCULAR | Status: AC
  Administered 2019-09-03: 12:00:00 0.5 mL via INTRAMUSCULAR
  Filled 2019-09-03: qty 0.5

## 2019-09-03 MED ORDER — METHOCARBAMOL 500 MG PO TABS: 500.0000 mg | ORAL_TABLET | Freq: Four times a day (QID) | ORAL | Status: DC | PRN

## 2019-09-03 MED ORDER — FENTANYL CITRATE (PF) 100 MCG/2ML IJ SOLN
50.0000 ug | Freq: Once | INTRAMUSCULAR | Status: AC
  Administered 2019-09-03: 12:00:00 50 ug via INTRAVENOUS
  Filled 2019-09-03: qty 2

## 2019-09-03 MED ORDER — FENTANYL CITRATE (PF) 250 MCG/5ML IJ SOLN
INTRAMUSCULAR | Status: AC
  Filled 2019-09-03: qty 5

## 2019-09-03 MED ORDER — LACTATED RINGERS IV SOLN: INTRAVENOUS | Status: DC

## 2019-09-03 MED ORDER — HYDROCODONE-ACETAMINOPHEN 7.5-325 MG PO TABS: 1.0000 | ORAL_TABLET | ORAL | Status: DC | PRN

## 2019-09-03 SURGICAL SUPPLY — 63 items
BIT DRILL 2.0 LNG QUCK RELEASE (BIT) IMPLANT
BIT DRILL 2.8 QUICK RELEASE (BIT) IMPLANT
BNDG CMPR 9X4 STRL LF SNTH (GAUZE/BANDAGES/DRESSINGS) ×1
BNDG ELASTIC 3X5.8 VLCR STR LF (GAUZE/BANDAGES/DRESSINGS) ×2 IMPLANT
BNDG ELASTIC 4X5.8 VLCR STR LF (GAUZE/BANDAGES/DRESSINGS) ×2 IMPLANT
BNDG ESMARK 4X9 LF (GAUZE/BANDAGES/DRESSINGS) ×2 IMPLANT
BNDG GAUZE ELAST 4 BULKY (GAUZE/BANDAGES/DRESSINGS) ×2 IMPLANT
CORD BIPOLAR FORCEPS 12FT (ELECTRODE) ×2 IMPLANT
COVER SURGICAL LIGHT HANDLE (MISCELLANEOUS) ×2 IMPLANT
COVER WAND RF STERILE (DRAPES) ×2 IMPLANT
CUFF TOURN SGL QUICK 18X4 (TOURNIQUET CUFF) ×2 IMPLANT
DRAPE OEC MINIVIEW 54X84 (DRAPES) ×2 IMPLANT
DRAPE SURG 17X11 SM STRL (DRAPES) ×2 IMPLANT
DRILL 2.0 LNG QUICK RELEASE (BIT) ×2
DRILL 2.8 QUICK RELEASE (BIT) ×2
DRSG ADAPTIC 3X8 NADH LF (GAUZE/BANDAGES/DRESSINGS) ×2 IMPLANT
GAUZE 4X4 16PLY RFD (DISPOSABLE) ×2 IMPLANT
GAUZE SPONGE 4X4 12PLY STRL (GAUZE/BANDAGES/DRESSINGS) ×2 IMPLANT
GAUZE XEROFORM 5X9 LF (GAUZE/BANDAGES/DRESSINGS) ×1 IMPLANT
GLOVE BIOGEL PI IND STRL 8.5 (GLOVE) ×1 IMPLANT
GLOVE BIOGEL PI INDICATOR 8.5 (GLOVE) ×1
GLOVE INDICATOR 7.0 STRL GRN (GLOVE) ×1 IMPLANT
GLOVE SURG ORTHO 8.0 STRL STRW (GLOVE) ×2 IMPLANT
GLOVE SURG SS PI 7.0 STRL IVOR (GLOVE) ×1 IMPLANT
GOWN STRL REUS W/ TWL LRG LVL3 (GOWN DISPOSABLE) ×1 IMPLANT
GOWN STRL REUS W/ TWL XL LVL3 (GOWN DISPOSABLE) ×1 IMPLANT
GOWN STRL REUS W/TWL LRG LVL3 (GOWN DISPOSABLE) ×4
GOWN STRL REUS W/TWL XL LVL3 (GOWN DISPOSABLE) ×4
GUIDEWIRE ORTHO 0.054X6 (WIRE) ×3 IMPLANT
K WIRE 1.1 ×2 IMPLANT
KIT BASIN OR (CUSTOM PROCEDURE TRAY) ×2 IMPLANT
KIT TURNOVER KIT B (KITS) ×2 IMPLANT
MANIFOLD NEPTUNE II (INSTRUMENTS) ×2 IMPLANT
NDL HYPO 25X1 1.5 SAFETY (NEEDLE) ×1 IMPLANT
NEEDLE HYPO 25X1 1.5 SAFETY (NEEDLE) ×2 IMPLANT
NS IRRIG 1000ML POUR BTL (IV SOLUTION) ×2 IMPLANT
PACK ORTHO EXTREMITY (CUSTOM PROCEDURE TRAY) ×2 IMPLANT
PAD ARMBOARD 7.5X6 YLW CONV (MISCELLANEOUS) ×4 IMPLANT
PAD CAST 4YDX4 CTTN HI CHSV (CAST SUPPLIES) ×1 IMPLANT
PADDING CAST ABS 4INX4YD NS (CAST SUPPLIES) ×2
PADDING CAST ABS COTTON 4X4 ST (CAST SUPPLIES) IMPLANT
PADDING CAST COTTON 4X4 STRL (CAST SUPPLIES) ×2
PLATE WRIST 171 SPAN SHORT HEX (Plate) IMPLANT
PLATE WRIST SPANNING SHORT (Plate) ×2 IMPLANT
SCREW HEX LOCK 2.7X14MM (Screw) ×1 IMPLANT
SCREW LOCK 12X2.7X HEXALOBE (Screw) IMPLANT
SCREW LOCK 12X3.5X HEXALOBE (Screw) IMPLANT
SCREW LOCKING 2.7X10 (Screw) ×1 IMPLANT
SCREW LOCKING 2.7X12MM (Screw) ×2 IMPLANT
SCREW LOCKING 3.5X12 (Screw) ×4 IMPLANT
SCREW NLCKG 2.7X10 HEXA (Screw) IMPLANT
SCREW NON LOCKING 2.7X10 (Screw) ×2 IMPLANT
SCREW NONLOCK HEX 3.5X12 (Screw) ×1 IMPLANT
SOAP 2 % CHG 4 OZ (WOUND CARE) ×2 IMPLANT
SPONGE LAP 4X18 RFD (DISPOSABLE) IMPLANT
SUT PROLENE 3 0 PS 2 (SUTURE) IMPLANT
SUT PROLENE 4 0 PS 2 18 (SUTURE) ×6 IMPLANT
SUT VIC AB 2-0 CT1 27 (SUTURE)
SUT VIC AB 2-0 CT1 TAPERPNT 27 (SUTURE) IMPLANT
SUT VIC AB 4-0 PS2 18 (SUTURE) IMPLANT
TOWEL GREEN STERILE (TOWEL DISPOSABLE) ×2 IMPLANT
TOWEL GREEN STERILE FF (TOWEL DISPOSABLE) ×2 IMPLANT
TUBE CONNECTING 12X1/4 (SUCTIONS) ×2 IMPLANT
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
## 2019-09-03 NOTE — Anesthesia Preprocedure Evaluation (Signed)
Anesthesia Evaluation  Patient identified by MRN, date of birth, ID band Patient awake    Reviewed: Allergy & Precautions, NPO status , Patient's Chart, lab work & pertinent test results  Airway Mallampati: II  TM Distance: >3 FB Neck ROM: Full    Dental  (+) Teeth Intact, Dental Advisory Given   Pulmonary    breath sounds clear to auscultation       Cardiovascular  Rhythm:Regular Rate:Normal     Neuro/Psych    GI/Hepatic   Endo/Other    Renal/GU      Musculoskeletal   Abdominal   Peds  Hematology   Anesthesia Other Findings   Reproductive/Obstetrics                             Anesthesia Physical Anesthesia Plan  ASA: III and emergent  Anesthesia Plan: MAC   Post-op Pain Management:  Regional for Post-op pain   Induction: Intravenous  PONV Risk Score and Plan:   Airway Management Planned: Natural Airway and Simple Face Mask  Additional Equipment:   Intra-op Plan:   Post-operative Plan:   Informed Consent: I have reviewed the patients History and Physical, chart, labs and discussed the procedure including the risks, benefits and alternatives for the proposed anesthesia with the patient or authorized representative who has indicated his/her understanding and acceptance.     Dental advisory given  Plan Discussed with: CRNA and Anesthesiologist  Anesthesia Plan Comments:         Anesthesia Quick Evaluation
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
## 2019-09-03 NOTE — Anesthesia Postprocedure Evaluation (Signed)
Anesthesia Post Note  Patient: Julie Woodward  Procedure(s) Performed: OPEN REDUCTION INTERNAL FIXATION (ORIF) RADIAL AND ULNAR  FRACTURE (Right )     Patient location during evaluation: PACU Anesthesia Type: MAC Level of consciousness: awake and alert Pain management: pain level controlled Vital Signs Assessment: post-procedure vital signs reviewed and stable Respiratory status: spontaneous breathing, nonlabored ventilation, respiratory function stable and patient connected to nasal cannula oxygen Cardiovascular status: blood pressure returned to baseline and stable Postop Assessment: no apparent nausea or vomiting Anesthetic complications: no    Last Vitals:  Vitals:   09/03/19 2229 09/03/19 2301  BP: (!) 146/83 130/71  Pulse: 98 (!) 103  Resp: 14   Temp:  36.6 C  SpO2: 97% 98%    Last Pain:  Vitals:   09/03/19 2307  TempSrc:   PainSc: 0-No pain                 Gailen Venne COKER
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
## 2019-09-03 NOTE — Anesthesia Postprocedure Evaluation (Signed)
Anesthesia Post Note  Patient: Julie Woodward  Procedure(s) Performed: OPEN REDUCTION INTERNAL FIXATION (ORIF) RADIAL AND ULNAR  FRACTURE (Right )     Patient location during evaluation: PACU Anesthesia Type: General Level of consciousness: awake and alert Pain management: pain level controlled Vital Signs Assessment: post-procedure vital signs reviewed and stable Respiratory status: spontaneous breathing, nonlabored ventilation, respiratory function stable and patient connected to nasal cannula oxygen Cardiovascular status: blood pressure returned to baseline and stable Postop Assessment: no apparent nausea or vomiting Anesthetic complications: no    Last Vitals:  Vitals:   09/03/19 2229 09/03/19 2301  BP: (!) 146/83 130/71  Pulse: 98 (!) 103  Resp: 14   Temp:  36.6 C  SpO2: 97% 98%    Last Pain:  Vitals:   09/03/19 2307  TempSrc:   PainSc: 0-No pain                 Tinesha Siegrist COKER

## 2019-09-03 NOTE — Anesthesia Procedure Notes (Signed)
Procedure Name: LMA Insertion Date/Time: 09/03/2019 6:45 PM Performed by: Jearld Pies, CRNA Pre-anesthesia Checklist: Patient identified, Emergency Drugs available, Suction available and Patient being monitored Patient Re-evaluated:Patient Re-evaluated prior to induction Oxygen Delivery Method: Circle System Utilized Preoxygenation: Pre-oxygenation with 100% oxygen Induction Type: IV induction Ventilation: Mask ventilation without difficulty LMA: LMA inserted LMA Size: 4.0 Number of attempts: 1 Airway Equipment and Method: Bite block Placement Confirmation: positive ETCO2 Tube secured with: Tape Dental Injury: Teeth and Oropharynx as per pre-operative assessment
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
## 2019-09-03 NOTE — Transfer of Care (Signed)
Immediate Anesthesia Transfer of Care Note  Patient: Julie Woodward  Procedure(s) Performed: OPEN REDUCTION INTERNAL FIXATION (ORIF) RADIAL AND ULNAR  FRACTURE (Right )  Patient Location: PACU  Anesthesia Type:General  Level of Consciousness: awake, alert  and oriented  Airway & Oxygen Therapy: Patient Spontanous Breathing  Post-op Assessment: Report given to RN and Post -op Vital signs reviewed and stable  Post vital signs: Reviewed and stable  Last Vitals:  Vitals Value Taken Time  BP 161/75 09/03/19 2114  Temp    Pulse 103 09/03/19 2116  Resp 14 09/03/19 2116  SpO2 94 % 09/03/19 2116  Vitals shown include unvalidated device data.  Last Pain:  Vitals:   09/03/19 1158  TempSrc: Oral  PainSc:          Complications: No apparent anesthesia complications

## 2019-09-03 NOTE — ED Notes (Signed)
Daughter in law given pts purse, jewelry .

## 2019-09-03 NOTE — ED Notes (Signed)
Pt c/o pain , Abby PA aware of latic 2.8

## 2019-09-03 NOTE — Progress Notes (Signed)
2 rings in a denture cup and glasses taken to PACU.

## 2019-09-03 NOTE — Anesthesia Procedure Notes (Signed)
Anesthesia Regional Block: Supraclavicular block   Pre-Anesthetic Checklist: ,, timeout performed, Correct Patient, Correct Site, Correct Laterality, Correct Procedure, Correct Position, site marked, Risks and benefits discussed,  Surgical consent,  Pre-op evaluation,  At surgeon's request and post-op pain management  Laterality: Right  Prep: chloraprep       Needles:  Injection technique: Single-shot  Needle Type: Stimulator Needle - 80          Additional Needles:   Procedures:,,,, ultrasound used (permanent image in chart),,,,  Narrative:  Start time: 09/03/2019 5:55 PM End time: 09/03/2019 6:05 PM Injection made incrementally with aspirations every 5 mL.  Performed by: Personally  Anesthesiologist: Roberts Gaudy, MD  Additional Notes: 20 cc 0.75 Naropin injected easily

## 2019-09-03 NOTE — Anesthesia Procedure Notes (Signed)
Procedure Name: MAC Date/Time: 09/03/2019 6:16 PM Performed by: Amadeo Garnet, CRNA Pre-anesthesia Checklist: Patient identified, Emergency Drugs available, Suction available and Patient being monitored Patient Re-evaluated:Patient Re-evaluated prior to induction Oxygen Delivery Method: Simple face mask Preoxygenation: Pre-oxygenation with 100% oxygen Induction Type: IV induction Placement Confirmation: positive ETCO2 Dental Injury: Teeth and Oropharynx as per pre-operative assessment

## 2019-09-04 MED ORDER — HYDROCODONE-ACETAMINOPHEN 5-325 MG PO TABS: 1.0000 | ORAL_TABLET | ORAL | 0 refills | Status: DC | PRN

## 2019-09-04 MED ORDER — METHOCARBAMOL 500 MG PO TABS: 500.0000 mg | ORAL_TABLET | Freq: Four times a day (QID) | ORAL | 0 refills | Status: DC | PRN

## 2019-09-04 NOTE — Discharge Instructions (Signed)
Discharge Instructions  - Keep dressings in place. Do not remove them. - The dressings must stay dry - Take all medication as prescribed. Transition to over the counter pain medication as your pain improves - Keep the hand elevated over the next 48-72 hours to help with pain and swelling - Move all digits not restricted by the dressings regularly to prevent stiffness - Please call to schedule a follow up appointment with Dr. Jeannie Fend and therapy at [phone] for 10-14 days following surgery - Your pain medication have been send digitally to your pharmacy  Get help right away if you:  Have difficulty breathing.  Have chest pain.  Feel light-headed.  Have fast or irregular heartbeats (palpitations).  Feel nauseous or have pain in your abdomen.
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
## 2019-09-04 NOTE — Progress Notes (Signed)
Pt is ambulating in the room. Right arm with splint dry and intact, right arm sling on. Discharge instructions given to pt. Discharged to home accompanied by daughter.
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
## 2019-09-07 ENCOUNTER — Encounter: Payer: Self-pay | Admitting: *Deleted

## 2019-09-07 DIAGNOSIS — Z4789 Encounter for other orthopedic aftercare: Secondary | ICD-10-CM | POA: Diagnosis not present

## 2019-09-07 DIAGNOSIS — S52571A Other intraarticular fracture of lower end of right radius, initial encounter for closed fracture: Secondary | ICD-10-CM | POA: Diagnosis not present

## 2019-09-09 ENCOUNTER — Encounter: Payer: Self-pay | Admitting: *Deleted

## 2019-09-16 DIAGNOSIS — Z4789 Encounter for other orthopedic aftercare: Secondary | ICD-10-CM | POA: Diagnosis not present

## 2019-09-16 DIAGNOSIS — M25531 Pain in right wrist: Secondary | ICD-10-CM | POA: Diagnosis not present

## 2019-09-16 DIAGNOSIS — S52571A Other intraarticular fracture of lower end of right radius, initial encounter for closed fracture: Secondary | ICD-10-CM | POA: Diagnosis not present

## 2019-09-26 DIAGNOSIS — M25531 Pain in right wrist: Secondary | ICD-10-CM | POA: Diagnosis not present

## 2019-10-14 DIAGNOSIS — Z4789 Encounter for other orthopedic aftercare: Secondary | ICD-10-CM | POA: Diagnosis not present

## 2019-10-14 DIAGNOSIS — S52571A Other intraarticular fracture of lower end of right radius, initial encounter for closed fracture: Secondary | ICD-10-CM | POA: Diagnosis not present

## 2019-10-14 DIAGNOSIS — M25531 Pain in right wrist: Secondary | ICD-10-CM | POA: Diagnosis not present

## 2019-11-11 ENCOUNTER — Other Ambulatory Visit: Payer: Self-pay | Admitting: Orthopaedic Surgery

## 2019-11-11 DIAGNOSIS — Z4789 Encounter for other orthopedic aftercare: Secondary | ICD-10-CM | POA: Diagnosis not present

## 2019-11-11 DIAGNOSIS — S52571A Other intraarticular fracture of lower end of right radius, initial encounter for closed fracture: Secondary | ICD-10-CM | POA: Diagnosis not present

## 2019-11-11 DIAGNOSIS — M25531 Pain in right wrist: Secondary | ICD-10-CM | POA: Diagnosis not present

## 2019-11-25 ENCOUNTER — Ambulatory Visit
Admission: RE | Admit: 2019-11-25 | Discharge: 2019-11-25 | Disposition: A | Discharge status: Home / Self Care | Payer: Medicare HMO | Source: Ambulatory Visit | Attending: Orthopaedic Surgery | Admitting: Orthopaedic Surgery

## 2019-11-25 ENCOUNTER — Other Ambulatory Visit: Payer: Self-pay

## 2019-11-25 DIAGNOSIS — M25531 Pain in right wrist: Secondary | ICD-10-CM

## 2019-11-25 DIAGNOSIS — S52601D Unspecified fracture of lower end of right ulna, subsequent encounter for closed fracture with routine healing: Secondary | ICD-10-CM | POA: Diagnosis not present

## 2019-11-25 DIAGNOSIS — S52501D Unspecified fracture of the lower end of right radius, subsequent encounter for closed fracture with routine healing: Secondary | ICD-10-CM | POA: Diagnosis not present

## 2019-12-22 ENCOUNTER — Other Ambulatory Visit: Payer: Self-pay

## 2019-12-22 DIAGNOSIS — Z472 Encounter for removal of internal fixation device: Secondary | ICD-10-CM | POA: Diagnosis not present

## 2019-12-22 DIAGNOSIS — M25531 Pain in right wrist: Secondary | ICD-10-CM | POA: Diagnosis not present

## 2019-12-22 DIAGNOSIS — L720 Epidermal cyst: Secondary | ICD-10-CM | POA: Diagnosis not present

## 2019-12-22 DIAGNOSIS — D481 Neoplasm of uncertain behavior of connective and other soft tissue: Secondary | ICD-10-CM | POA: Diagnosis not present

## 2019-12-30 DIAGNOSIS — M25631 Stiffness of right wrist, not elsewhere classified: Secondary | ICD-10-CM | POA: Diagnosis not present

## 2020-01-04 DIAGNOSIS — S52571A Other intraarticular fracture of lower end of right radius, initial encounter for closed fracture: Secondary | ICD-10-CM | POA: Diagnosis not present

## 2020-01-04 DIAGNOSIS — Z4789 Encounter for other orthopedic aftercare: Secondary | ICD-10-CM | POA: Diagnosis not present

## 2020-01-11 DIAGNOSIS — M25631 Stiffness of right wrist, not elsewhere classified: Secondary | ICD-10-CM | POA: Diagnosis not present

## 2020-01-18 DIAGNOSIS — M25631 Stiffness of right wrist, not elsewhere classified: Secondary | ICD-10-CM | POA: Diagnosis not present

## 2020-01-25 DIAGNOSIS — M25631 Stiffness of right wrist, not elsewhere classified: Secondary | ICD-10-CM | POA: Diagnosis not present

## 2020-01-25 DIAGNOSIS — M79644 Pain in right finger(s): Secondary | ICD-10-CM | POA: Diagnosis not present

## 2020-02-01 DIAGNOSIS — S52571A Other intraarticular fracture of lower end of right radius, initial encounter for closed fracture: Secondary | ICD-10-CM | POA: Diagnosis not present

## 2020-02-01 DIAGNOSIS — Z4789 Encounter for other orthopedic aftercare: Secondary | ICD-10-CM | POA: Diagnosis not present

## 2020-02-01 DIAGNOSIS — M25631 Stiffness of right wrist, not elsewhere classified: Secondary | ICD-10-CM | POA: Diagnosis not present

## 2020-02-07 DIAGNOSIS — E039 Hypothyroidism, unspecified: Secondary | ICD-10-CM | POA: Diagnosis not present

## 2020-02-07 DIAGNOSIS — Z1389 Encounter for screening for other disorder: Secondary | ICD-10-CM | POA: Diagnosis not present

## 2020-02-07 DIAGNOSIS — Z0000 Encounter for general adult medical examination without abnormal findings: Secondary | ICD-10-CM | POA: Diagnosis not present

## 2020-02-07 DIAGNOSIS — E7800 Pure hypercholesterolemia, unspecified: Secondary | ICD-10-CM | POA: Diagnosis not present

## 2020-02-20 DIAGNOSIS — L237 Allergic contact dermatitis due to plants, except food: Secondary | ICD-10-CM | POA: Diagnosis not present

## 2020-04-17 DIAGNOSIS — Z20822 Contact with and (suspected) exposure to covid-19: Secondary | ICD-10-CM | POA: Diagnosis not present

## 2020-05-09 DIAGNOSIS — Z1231 Encounter for screening mammogram for malignant neoplasm of breast: Secondary | ICD-10-CM | POA: Diagnosis not present

## 2020-05-18 DIAGNOSIS — H5203 Hypermetropia, bilateral: Secondary | ICD-10-CM | POA: Diagnosis not present

## 2020-06-25 DIAGNOSIS — Z20822 Contact with and (suspected) exposure to covid-19: Secondary | ICD-10-CM | POA: Diagnosis not present

## 2020-06-25 DIAGNOSIS — U071 COVID-19: Secondary | ICD-10-CM | POA: Diagnosis not present

## 2020-06-30 DIAGNOSIS — U071 COVID-19: Secondary | ICD-10-CM | POA: Diagnosis not present

## 2020-06-30 DIAGNOSIS — Z9189 Other specified personal risk factors, not elsewhere classified: Secondary | ICD-10-CM | POA: Diagnosis not present

## 2020-07-01 DIAGNOSIS — R0989 Other specified symptoms and signs involving the circulatory and respiratory systems: Secondary | ICD-10-CM | POA: Diagnosis not present

## 2020-07-01 DIAGNOSIS — R0602 Shortness of breath: Secondary | ICD-10-CM | POA: Diagnosis not present

## 2020-07-01 DIAGNOSIS — Z8616 Personal history of COVID-19: Secondary | ICD-10-CM | POA: Diagnosis not present

## 2020-07-01 DIAGNOSIS — Z20822 Contact with and (suspected) exposure to covid-19: Secondary | ICD-10-CM | POA: Diagnosis not present

## 2020-07-01 DIAGNOSIS — Z1159 Encounter for screening for other viral diseases: Secondary | ICD-10-CM | POA: Diagnosis not present

## 2020-07-03 DIAGNOSIS — U071 COVID-19: Secondary | ICD-10-CM | POA: Diagnosis not present

## 2020-07-03 DIAGNOSIS — J1282 Pneumonia due to coronavirus disease 2019: Secondary | ICD-10-CM | POA: Diagnosis not present

## 2020-07-03 DIAGNOSIS — R0981 Nasal congestion: Secondary | ICD-10-CM | POA: Diagnosis not present

## 2020-07-03 DIAGNOSIS — R0602 Shortness of breath: Secondary | ICD-10-CM | POA: Diagnosis not present

## 2020-07-05 ENCOUNTER — Inpatient Hospital Stay (HOSPITAL_COMMUNITY)
Admission: EM | Admit: 2020-07-05 | Discharge: 2020-07-07 | DRG: 177 | Disposition: A | Discharge status: Home / Self Care | Payer: Medicare HMO | Attending: Internal Medicine | Admitting: Internal Medicine

## 2020-07-05 ENCOUNTER — Other Ambulatory Visit: Payer: Self-pay

## 2020-07-05 ENCOUNTER — Emergency Department (HOSPITAL_COMMUNITY): Payer: Medicare HMO

## 2020-07-05 ENCOUNTER — Encounter (HOSPITAL_COMMUNITY): Payer: Self-pay | Admitting: Emergency Medicine

## 2020-07-05 DIAGNOSIS — J9601 Acute respiratory failure with hypoxia: Secondary | ICD-10-CM | POA: Diagnosis not present

## 2020-07-05 DIAGNOSIS — Z743 Need for continuous supervision: Secondary | ICD-10-CM | POA: Diagnosis not present

## 2020-07-05 DIAGNOSIS — E039 Hypothyroidism, unspecified: Secondary | ICD-10-CM | POA: Diagnosis present

## 2020-07-05 DIAGNOSIS — Z885 Allergy status to narcotic agent status: Secondary | ICD-10-CM

## 2020-07-05 DIAGNOSIS — J1282 Pneumonia due to coronavirus disease 2019: Secondary | ICD-10-CM | POA: Diagnosis present

## 2020-07-05 DIAGNOSIS — Z87891 Personal history of nicotine dependence: Secondary | ICD-10-CM | POA: Diagnosis not present

## 2020-07-05 DIAGNOSIS — Z9049 Acquired absence of other specified parts of digestive tract: Secondary | ICD-10-CM

## 2020-07-05 DIAGNOSIS — J069 Acute upper respiratory infection, unspecified: Secondary | ICD-10-CM | POA: Diagnosis present

## 2020-07-05 DIAGNOSIS — U071 COVID-19: Principal | ICD-10-CM | POA: Diagnosis present

## 2020-07-05 DIAGNOSIS — R0600 Dyspnea, unspecified: Secondary | ICD-10-CM | POA: Diagnosis not present

## 2020-07-05 DIAGNOSIS — R0902 Hypoxemia: Secondary | ICD-10-CM | POA: Diagnosis not present

## 2020-07-05 DIAGNOSIS — K219 Gastro-esophageal reflux disease without esophagitis: Secondary | ICD-10-CM | POA: Diagnosis present

## 2020-07-05 DIAGNOSIS — R059 Cough, unspecified: Secondary | ICD-10-CM | POA: Diagnosis not present

## 2020-07-05 DIAGNOSIS — R0689 Other abnormalities of breathing: Secondary | ICD-10-CM | POA: Diagnosis not present

## 2020-07-05 DIAGNOSIS — K449 Diaphragmatic hernia without obstruction or gangrene: Secondary | ICD-10-CM | POA: Diagnosis not present

## 2020-07-05 DIAGNOSIS — Z90710 Acquired absence of both cervix and uterus: Secondary | ICD-10-CM

## 2020-07-05 DIAGNOSIS — I2699 Other pulmonary embolism without acute cor pulmonale: Secondary | ICD-10-CM | POA: Diagnosis not present

## 2020-07-05 DIAGNOSIS — Z9851 Tubal ligation status: Secondary | ICD-10-CM | POA: Diagnosis not present

## 2020-07-05 DIAGNOSIS — R0602 Shortness of breath: Secondary | ICD-10-CM

## 2020-07-05 DIAGNOSIS — Z79899 Other long term (current) drug therapy: Secondary | ICD-10-CM

## 2020-07-05 DIAGNOSIS — R7981 Abnormal blood-gas level: Secondary | ICD-10-CM | POA: Diagnosis not present

## 2020-07-05 LAB — CBC WITH DIFFERENTIAL/PLATELET
Abs Immature Granulocytes: 0.04 10*3/uL (ref 0.00–0.07)
Basophils Absolute: 0 10*3/uL (ref 0.0–0.1)
Basophils Relative: 0 %
Eosinophils Absolute: 0 10*3/uL (ref 0.0–0.5)
Eosinophils Relative: 0 %
HCT: 38.7 % (ref 36.0–46.0)
Hemoglobin: 12.7 g/dL (ref 12.0–15.0)
Immature Granulocytes: 1 %
Lymphocytes Relative: 5 %
Lymphs Abs: 0.4 10*3/uL — ABNORMAL LOW (ref 0.7–4.0)
MCH: 29.8 pg (ref 26.0–34.0)
MCHC: 32.8 g/dL (ref 30.0–36.0)
MCV: 90.8 fL (ref 80.0–100.0)
Monocytes Absolute: 0.6 10*3/uL (ref 0.1–1.0)
Monocytes Relative: 7 %
Neutro Abs: 7.4 10*3/uL (ref 1.7–7.7)
Neutrophils Relative %: 87 %
Platelets: 208 10*3/uL (ref 150–400)
RBC: 4.26 MIL/uL (ref 3.87–5.11)
RDW: 12.7 % (ref 11.5–15.5)
WBC: 8.4 10*3/uL (ref 4.0–10.5)
nRBC: 0 % (ref 0.0–0.2)

## 2020-07-05 LAB — COMPREHENSIVE METABOLIC PANEL
ALT: 31 U/L (ref 0–44)
AST: 36 U/L (ref 15–41)
Albumin: 3.2 g/dL — ABNORMAL LOW (ref 3.5–5.0)
Alkaline Phosphatase: 56 U/L (ref 38–126)
Anion gap: 10 (ref 5–15)
BUN: 19 mg/dL (ref 8–23)
CO2: 25 mmol/L (ref 22–32)
Calcium: 8.8 mg/dL — ABNORMAL LOW (ref 8.9–10.3)
Chloride: 104 mmol/L (ref 98–111)
Creatinine, Ser: 0.68 mg/dL (ref 0.44–1.00)
GFR, Estimated: >60 mL/min (ref >60)
Glucose, Bld: 123 mg/dL — ABNORMAL HIGH (ref 70–99)
Potassium: 3.9 mmol/L (ref 3.5–5.1)
Sodium: 139 mmol/L (ref 135–145)
Total Bilirubin: 0.6 mg/dL (ref 0.3–1.2)
Total Protein: 7.2 g/dL (ref 6.5–8.1)

## 2020-07-05 LAB — TROPONIN I (HIGH SENSITIVITY)
Troponin I (High Sensitivity): 8 ng/L (ref <18)
Troponin I (High Sensitivity): 5 ng/L (ref <18)

## 2020-07-05 LAB — D-DIMER, QUANTITATIVE: D-Dimer, Quant: 1.46 ug/mL-FEU — ABNORMAL HIGH (ref 0.00–0.50)

## 2020-07-05 LAB — BRAIN NATRIURETIC PEPTIDE: B Natriuretic Peptide: 57.5 pg/mL (ref 0.0–100.0)

## 2020-07-05 LAB — LACTATE DEHYDROGENASE: LDH: 276 U/L — ABNORMAL HIGH (ref 98–192)

## 2020-07-05 LAB — MAGNESIUM: Magnesium: 1.8 mg/dL (ref 1.7–2.4)

## 2020-07-05 LAB — PHOSPHORUS: Phosphorus: 2.7 mg/dL (ref 2.5–4.6)

## 2020-07-05 MED ORDER — ONDANSETRON HCL 4 MG PO TABS: 4.0000 mg | ORAL_TABLET | Freq: Four times a day (QID) | ORAL | Status: DC | PRN

## 2020-07-05 MED ORDER — ALBUTEROL SULFATE HFA 108 (90 BASE) MCG/ACT IN AERS
2.0000 | INHALATION_SPRAY | Freq: Four times a day (QID) | RESPIRATORY_TRACT | Status: DC
  Administered 2020-07-05 – 2020-07-07 (×7): 2 via RESPIRATORY_TRACT
  Filled 2020-07-05 (×2): qty 6.7

## 2020-07-05 MED ORDER — ACETAMINOPHEN 325 MG PO TABS: 650.0000 mg | ORAL_TABLET | Freq: Four times a day (QID) | ORAL | Status: DC | PRN

## 2020-07-05 MED ORDER — SODIUM CHLORIDE 0.9% FLUSH
3.0000 mL | Freq: Two times a day (BID) | INTRAVENOUS | Status: DC
  Administered 2020-07-05: 3 mL via INTRAVENOUS

## 2020-07-05 MED ORDER — GUAIFENESIN-DM 100-10 MG/5ML PO SYRP: 10.0000 mL | ORAL_SOLUTION | ORAL | Status: DC | PRN

## 2020-07-05 MED ORDER — PREDNISONE 20 MG PO TABS: 50.0000 mg | ORAL_TABLET | Freq: Every day | ORAL | Status: DC

## 2020-07-05 MED ORDER — ENOXAPARIN SODIUM 40 MG/0.4ML ~~LOC~~ SOLN
40.0000 mg | SUBCUTANEOUS | Status: DC
  Administered 2020-07-05 – 2020-07-06 (×2): 40 mg via SUBCUTANEOUS
  Filled 2020-07-05 (×2): qty 0.4

## 2020-07-05 MED ORDER — SODIUM CHLORIDE 0.9 % IV SOLN
100.0000 mg | Freq: Every day | INTRAVENOUS | Status: DC
  Administered 2020-07-06 – 2020-07-07 (×2): 100 mg via INTRAVENOUS
  Filled 2020-07-05 (×2): qty 20

## 2020-07-05 MED ORDER — SODIUM CHLORIDE 0.9 % IV SOLN: 250.0000 mL | INTRAVENOUS | Status: DC | PRN

## 2020-07-05 MED ORDER — SODIUM CHLORIDE 0.9% FLUSH
3.0000 mL | Freq: Two times a day (BID) | INTRAVENOUS | Status: DC
  Administered 2020-07-05 – 2020-07-07 (×3): 3 mL via INTRAVENOUS

## 2020-07-05 MED ORDER — ONDANSETRON HCL 4 MG/2ML IJ SOLN: 4.0000 mg | Freq: Four times a day (QID) | INTRAMUSCULAR | Status: DC | PRN

## 2020-07-05 MED ORDER — SODIUM CHLORIDE 0.9 % IV SOLN
200.0000 mg | Freq: Once | INTRAVENOUS | Status: AC
  Administered 2020-07-05: 200 mg via INTRAVENOUS
  Filled 2020-07-05: qty 200

## 2020-07-05 MED ORDER — METHYLPREDNISOLONE SODIUM SUCC 40 MG IJ SOLR
0.5000 mg/kg | Freq: Two times a day (BID) | INTRAMUSCULAR | Status: DC
Start: 2020-07-05 — End: 2020-07-07
  Administered 2020-07-05 – 2020-07-07 (×4): 38 mg via INTRAVENOUS
  Filled 2020-07-05 (×4): qty 1

## 2020-07-05 MED ORDER — SODIUM CHLORIDE 0.9% FLUSH: 3.0000 mL | INTRAVENOUS | Status: DC | PRN

## 2020-07-05 MED ORDER — IOHEXOL 350 MG/ML SOLN
100.0000 mL | Freq: Once | INTRAVENOUS | Status: AC | PRN
  Administered 2020-07-05: 75 mL via INTRAVENOUS

## 2020-07-05 MED ORDER — ALBUTEROL SULFATE HFA 108 (90 BASE) MCG/ACT IN AERS: 2.0000 | INHALATION_SPRAY | Freq: Four times a day (QID) | RESPIRATORY_TRACT | Status: DC | PRN

## 2020-07-05 NOTE — ED Provider Notes (Signed)
3:52 PM  Care assumed from Dr. Laverta Baltimore.  At time of transfer of care, patient is awaiting for results of CT PE study.  Patient recently had COVID-19 infection and presents with worsening hypoxia shortness of breath and some chest discomfort.  Patient was found to need 2 L oxygen by nasal cannula to maintain oxygen saturations and she does not take oxygen normally.  Plan of care will be to admit the patient after work-up given her worsening symptoms and the new hypoxia.  Her D-dimer was elevated so a PE study was ordered.  She reportedly recently completed antibiotics and steroids today with Levaquin and steroids.  Anticipate admission after imaging.  6:06 PM CT scan does not show pulmonary embolism.  Does show diffuse groundglass opacities which are new and compatible COVID-19 pneumonia.  We'll call for admission for further management.   Clinical Impression: 1. Hypoxia   2. SOB (shortness of breath)   3. Cough     Disposition: Admit  This note was prepared with assistance of Dragon voice recognition software. Occasional wrong-word or sound-a-like substitutions may have occurred due to the inherent limitations of voice recognition software.     Tegeler, Gwenyth Allegra, MD 07/05/20 1900

## 2020-07-05 NOTE — ED Triage Notes (Signed)
Patient BIBA from [facility], tested positive for COVID pneumonia approx 2 weeks ago. Patient was 85% on RA and 97% on 2L [place]. Reports still having trouble breathing. AOx4  BP 126/78 HR 90 T 97.5

## 2020-07-05 NOTE — ED Provider Notes (Addendum)
Emergency Department Provider Note   I have reviewed the triage vital signs and the nursing notes.   HISTORY  Chief Complaint Covid Positive and Shortness of Breath   HPI Julie Woodward is a 75 y.o. female with past medical history reviewed below including COVID-19 in mid/late January now out of quarantine presents to the emergency department for evaluation of continued shortness of breath which is worsening slightly. She denies any associated chest pain. She has been going to [facility] and today completed her course of Levaquin and an additional steroid dose. She states this is her second round of antibiotic and steroid with no relief in symptoms. She states there was a chest x-ray done at [facility] showing COVID PNA. She is not appreciating swelling in her legs. No abdominal pain, diarrhea, vomiting. No history of oxygen use.     Past Medical History:  Diagnosis Date  . Arm mass, right   . Bladder incontinence    no meds  . GERD (gastroesophageal reflux disease)   . Hypothyroidism    no meds  . Plantar fasciitis   . Schwannoma     Patient Active Problem List   Diagnosis Date Noted  . Acute respiratory disease due to COVID-19 virus 07/05/2020  . COVID-19 virus infection 07/05/2020  . Closed fracture of right distal radius 09/03/2019    Past Surgical History:  Procedure Laterality Date  . ABDOMINAL HYSTERECTOMY    . ABDOMINOPLASTY  1993  . ANKLE HARDWARE REMOVAL  2010  . APPENDECTOMY    . BLADDER SURGERY  1982  . BREAST REDUCTION SURGERY  1993  . COLON SURGERY    . DILATION AND CURETTAGE OF UTERUS    . MASS EXCISION Right 02/10/2018   Procedure: EXCISION OF RIGHT UPPER ARM MASS;  Surgeon: [name], MD;  Location: [facility];  Service: General;  Laterality: Right;  . ORIF ANKLE FRACTURE    . ORIF RADIAL FRACTURE Right 09/03/2019   Procedure: OPEN REDUCTION INTERNAL FIXATION (ORIF) RADIAL AND ULNAR  FRACTURE;  Surgeon: Verner Mould, MD;  Location: [facility];  Service: Orthopedics;  Laterality: Right;  . RETROMASTOID CRANIOTOMY Left    for schwannoma  . SEPTOPLASTY Bilateral   . TUBAL LIGATION    . VENTRICULOPERITONEAL SHUNT      Allergies Morphine and related  Family History  Problem Relation Age of Onset  . Hypertension Other     Social History Social History   Tobacco Use  . Smoking status: Former Research scientist (life sciences)  . Smokeless tobacco: Never Used  . Tobacco comment: 40 years ago   Substance Use Topics  . Alcohol use: Not Currently  . Drug use: Never    Review of Systems  Constitutional: No fever/chills Eyes: No visual changes. ENT: No sore throat. Cardiovascular: Denies chest pain. Respiratory: Positive shortness of breath and cough.  Gastrointestinal: No abdominal pain.  No nausea, no vomiting.  No diarrhea.  No constipation. Genitourinary: Negative for dysuria. Musculoskeletal: Negative for back pain. Skin: Negative for rash. Neurological: Negative for headaches, focal weakness or numbness.  10-point ROS otherwise negative.  ____________________________________________   PHYSICAL EXAM:  VITAL SIGNS: ED Triage Vitals  Enc Vitals Group     BP 07/05/20 1111 (!) 119/58     Pulse Rate 07/05/20 1111 94     Resp 07/05/20 1111 17     Temp 07/05/20 1111 98.6 F (37 C)     Temp Source 07/05/20 1111 Oral  SpO2 07/05/20 1111 96 %     Weight 07/05/20 1151 167 lb (75.8 kg)     Height 07/05/20 1151 5\' 5"  (1.651 m)   Constitutional: Alert and oriented. Well appearing and in no acute distress. Eyes: Conjunctivae are normal.  Head: Atraumatic. Nose: No congestion/rhinnorhea. Mouth/Throat: Mucous membranes are moist.  Neck: No stridor.  Cardiovascular: Normal rate, regular rhythm. Good peripheral circulation. Grossly normal heart sounds.   Respiratory: Normal respiratory effort.  No retractions. Lungs CTAB. Gastrointestinal: Soft and nontender. No distention.  Musculoskeletal: No lower  extremity tenderness nor edema. No gross deformities of extremities. Neurologic:  Normal speech and language. Skin:  Skin is warm, dry and intact. No rash noted.   ____________________________________________   LABS (all labs ordered are listed, but only abnormal results are displayed)  Labs Reviewed  COMPREHENSIVE METABOLIC PANEL - Abnormal; Notable for the following components:      Result Value   Glucose, Bld 123 (*)    Calcium 8.8 (*)    Albumin 3.2 (*)    All other components within normal limits  CBC WITH DIFFERENTIAL/PLATELET - Abnormal; Notable for the following components:   Lymphs Abs 0.4 (*)    All other components within normal limits  D-DIMER, QUANTITATIVE (NOT AT [facility]) - Abnormal; Notable for the following components:   D-Dimer, Quant 1.46 (*)    All other components within normal limits  C-REACTIVE PROTEIN - Abnormal; Notable for the following components:   CRP 13.1 (*)    All other components within normal limits  LACTATE DEHYDROGENASE - Abnormal; Notable for the following components:   LDH 276 (*)    All other components within normal limits  CBC WITH DIFFERENTIAL/PLATELET - Abnormal; Notable for the following components:   Hemoglobin 11.8 (*)    HCT 35.6 (*)    Lymphs Abs 0.6 (*)    All other components within normal limits  COMPREHENSIVE METABOLIC PANEL - Abnormal; Notable for the following components:   Glucose, Bld 136 (*)    Calcium 8.7 (*)    Albumin 2.8 (*)    All other components within normal limits  C-REACTIVE PROTEIN - Abnormal; Notable for the following components:   CRP 14.4 (*)    All other components within normal limits  D-DIMER, QUANTITATIVE (NOT AT [facility]) - Abnormal; Notable for the following components:   D-Dimer, Quant 1.23 (*)    All other components within normal limits  FIBRINOGEN - Abnormal; Notable for the following components:   Fibrinogen 632 (*)    All other components within normal limits  EXPECTORATED SPUTUM ASSESSMENT W  REFEX TO RESP CULTURE  CULTURE, RESPIRATORY  BRAIN NATRIURETIC PEPTIDE  FERRITIN  PROCALCITONIN  MAGNESIUM  PHOSPHORUS  PROCALCITONIN  FERRITIN  MAGNESIUM  PHOSPHORUS  ABO/RH  TROPONIN I (HIGH SENSITIVITY)  TROPONIN I (HIGH SENSITIVITY)   ____________________________________________  EKG   EKG Interpretation  Date/Time:  Thursday July 05 2020 12:14:21 EST Ventricular Rate:  88 PR Interval:    QRS Duration: 79 QT Interval:  347 QTC Calculation: 420 R Axis:   -32 Text Interpretation: Sinus rhythm Left axis deviation 12 Lead; [place] No STEMI Confirmed by Nanda Quinton [phone]) on 07/05/2020 1:05:57 PM       ____________________________________________  RADIOLOGY  CT Angio Chest PE W and/or Wo Contrast  Result Date: 07/05/2020 CLINICAL DATA:  COVID positive 2 weeks prior.  Hypoxia.  Dyspnea. EXAM: CT ANGIOGRAPHY CHEST WITH CONTRAST TECHNIQUE: Multidetector CT imaging of the chest was performed using the standard protocol during  bolus administration of intravenous contrast. Multiplanar CT image reconstructions and MIPs were obtained to evaluate the vascular anatomy. CONTRAST:  28mL OMNIPAQUE IOHEXOL 350 MG/ML SOLN COMPARISON:  Chest radiograph from earlier today. 09/03/2019 chest CT. FINDINGS: Cardiovascular: The study is high quality for the evaluation of pulmonary embolism. There are no filling defects in the central, lobar, segmental or subsegmental pulmonary artery branches to suggest acute pulmonary embolism. Mildly atherosclerotic nonaneurysmal thoracic aorta. Top-normal caliber main pulmonary artery (3.1 cm diameter). Normal heart size. No significant pericardial fluid/thickening. Mediastinum/Nodes: No discrete thyroid nodules. Unremarkable esophagus. No pathologically enlarged axillary, mediastinal or hilar lymph nodes. Lungs/Pleura: No pneumothorax. No pleural effusion. Moderate to severe patchy ground-glass opacity with interlobular septal thickening throughout both  lungs involving all lung lobes, new. No lung masses or significant pulmonary nodules. Upper abdomen: Large hiatal hernia.  Stomach is nondistended. Musculoskeletal: No aggressive appearing focal osseous lesions. Mild thoracic spondylosis. Review of the MIP images confirms the above findings. IMPRESSION: 1. No pulmonary embolism. 2. Moderate to severe patchy ground-glass opacity with interlobular septal thickening throughout both lungs, new, compatible with COVID-19 pneumonia. 3. Large hiatal hernia. 4. Aortic Atherosclerosis (ICD10-I70.0). Electronically Signed   By: Ilona Sorrel M.D.   On: 07/05/2020 16:14   DG Chest Portable 1 View  Result Date: 07/05/2020 CLINICAL DATA:  Shortness of breath. EXAM: PORTABLE CHEST 1 VIEW COMPARISON:  September 03, 2019. FINDINGS: The heart size and mediastinal contours are within normal limits. No pneumothorax or pleural effusion is noted. Patchy airspace opacities are noted in both lungs concerning for multifocal pneumonia. The visualized skeletal structures are unremarkable. IMPRESSION: Patchy bilateral airspace opacities are noted concerning for multifocal pneumonia. Electronically Signed   By: Marijo Conception M.D.   On: 07/05/2020 12:51    ____________________________________________   PROCEDURES  Procedure(s) performed:   Procedures  CRITICAL CARE Performed by: Margette Fast Total critical care time: 35 minutes Critical care time was exclusive of separately billable procedures and treating other patients. Critical care was necessary to treat or prevent imminent or life-threatening deterioration. Critical care was time spent personally by me on the following activities: development of treatment plan with patient and/or surrogate as well as nursing, discussions with consultants, evaluation of patient's response to treatment, examination of patient, obtaining history from patient or surrogate, ordering and performing treatments and interventions, ordering and  review of laboratory studies, ordering and review of radiographic studies, pulse oximetry and re-evaluation of patient's condition.  Nanda Quinton, MD Emergency Medicine  ____________________________________________   INITIAL IMPRESSION / ASSESSMENT AND PLAN / ED COURSE  Pertinent labs & imaging results that were available during my care of the patient were reviewed by me and considered in my medical decision making (see chart for details).   Patient presents to the emergency department with increased shortness of breath and cough status post Covid 19. She has clear lung sounds but on arrival has O2 sat of 85% and placed on 2 L nasal cannula with saturations improving to the mid 90s and feeling more comfortable. She is not tachycardic but has soft blood pressures. She is awake and alert. Has completed Levaquin course ending today along with steroid burst. Will repeat chest x-ray to assess for infiltrates but also considering PE is a possibility with recent COVID-19 infection as a sequela to this. EKG and exam not consistent with symptomatic pericardial effusion or new CHF.   CXR and labs reviewed. Patient continues to require O2. CTA pending. Care transferred to Dr. Sherry Ruffing.  I reviewed all nursing notes, vitals, pertinent old records, EKGs, labs, imaging (as available).  ____________________________________________  FINAL CLINICAL IMPRESSION(S) / ED DIAGNOSES  Final diagnoses:  Hypoxia  SOB (shortness of breath)  Cough     MEDICATIONS GIVEN DURING THIS VISIT:  Medications  remdesivir 200 mg in sodium chloride 0.9% 250 mL IVPB (0 mg Intravenous Stopped 07/06/20 0222)    Followed by  remdesivir 100 mg in sodium chloride 0.9 % 100 mL IVPB (has no administration in time range)  albuterol (VENTOLIN HFA) 108 (90 Base) MCG/ACT inhaler 2 puff (2 puffs Inhalation Given 07/06/20 0221)  methylPREDNISolone sodium succinate (SOLU-MEDROL) 40 mg/mL injection 38 mg (38 mg Intravenous Given 07/05/20  2105)    Followed by  predniSONE (DELTASONE) tablet 50 mg (has no administration in time range)  albuterol (VENTOLIN HFA) 108 (90 Base) MCG/ACT inhaler 2 puff (has no administration in time range)  sodium chloride flush (NS) 0.9 % injection 3 mL (3 mLs Intravenous Given 07/05/20 2357)  sodium chloride flush (NS) 0.9 % injection 3 mL (3 mLs Intravenous Given 07/05/20 2357)  sodium chloride flush (NS) 0.9 % injection 3 mL (has no administration in time range)  0.9 %  sodium chloride infusion (has no administration in time range)  guaiFENesin-dextromethorphan (ROBITUSSIN DM) 100-10 MG/5ML syrup 10 mL (has no administration in time range)  acetaminophen (TYLENOL) tablet 650 mg (has no administration in time range)  ondansetron (ZOFRAN) tablet 4 mg (has no administration in time range)    Or  ondansetron (ZOFRAN) injection 4 mg (has no administration in time range)  enoxaparin (LOVENOX) injection 40 mg (40 mg Subcutaneous Given 07/05/20 2356)  iohexol (OMNIPAQUE) 350 MG/ML injection 100 mL (75 mLs Intravenous Contrast Given 07/05/20 1541)    Note:  This document was prepared using Dragon voice recognition software and may include unintentional dictation errors.  Nanda Quinton, MD, [facility] Emergency Medicine    Zubin Pontillo, [facility], MD 07/06/20 [id number]    Margette Fast, MD 07/06/20 [phone]

## 2020-07-05 NOTE — H&P (Signed)
Julie Woodward [id number] DOB: 28-Aug-1945 DOA: 07/05/2020     PCP: Carol Ada, MD   Outpatient Specialists:  NONE    Patient arrived to ER on 07/05/20 at 1052 Referred by Attending Tegeler, Gwenyth Allegra, *   Patient coming from: home Lives  With family    Chief Complaint:  Chief Complaint  Patient presents with  . Covid Positive  . Shortness of Breath    HPI: Julie Woodward is a 75 y.o. female with medical history significant of  hypothyroidism, GERD, schwannoma  COvid 06/21/20  Presented with shortness of breath and hypoxia Presented to UC today shortness of breath found to be satting 85% on room air started 2 L nasal cannula improved to 97. Reports her shortness of breath is progressively gotten worse since his diagnosis.  He did not have any chest pain associated this.  He has been treated with Levaquin by his PCP and some steroids.  Started on the second round of antibiotics and steroids but still continues to have shortness of breath. Recent chest x-ray done showed multifocal pneumonia. Her husband has gotten sick as well but he is recovering  Infectious risk factors:  Reports shortness of breath, dry cough,  Fatigue, sore throat, nausea, loss of taste and smell    KNOWN COVID POSITIVE since 06/21/2020 from home test  Has NOt been vaccinated against COVID (Refuses but will think about it)    Lab Results  Component Value Date   [place] NEGATIVE 09/03/2019    Regarding pertinent Chronic problems:    Hypothyroidism: No results found for: TSH not on synthroid      While in ER: CTA negative for PE but showing diffuse groundglass opacities    Hospitalist was called for admission for acute respiratory failure secondary to Covid  The following Work up has been ordered so far:  Orders Placed This Encounter  Procedures  . DG Chest Portable 1 View  . CT Angio Chest PE W and/or Wo Contrast  . Comprehensive metabolic panel  . Brain natriuretic peptide   . CBC with Differential  . D-dimer, quantitative  . Cardiac monitoring  . Consult to hospitalist  ALL PATIENTS BEING ADMITTED/HAVING PROCEDURES NEED COVID-19 SCREENING  . ED EKG  . [place] lock IV    Following Medications were ordered in ER: Medications  iohexol (OMNIPAQUE) 350 MG/ML injection 100 mL (75 mLs Intravenous Contrast Given 07/05/20 1541)        Consult Orders  (From admission, onward)         Start     Ordered   07/05/20 1807  Consult to hospitalist  ALL PATIENTS BEING ADMITTED/HAVING PROCEDURES NEED COVID-19 SCREENING  Once       Comments: ALL PATIENTS BEING ADMITTED/HAVING PROCEDURES NEED COVID-19 SCREENING  Provider:  (Not yet assigned)  Question Answer Comment  Place call to: [facility]   Reason for Consult Admit      07/05/20 1806          Significant initial  Findings: Abnormal Labs Reviewed  COMPREHENSIVE METABOLIC PANEL - Abnormal; Notable for the following components:      Result Value   Glucose, Bld 123 (*)    Calcium 8.8 (*)    Albumin 3.2 (*)    All other components within normal limits  CBC WITH DIFFERENTIAL/PLATELET - Abnormal; Notable for the following components:   Lymphs Abs 0.4 (*)    All other components within normal limits  D-DIMER, QUANTITATIVE (NOT AT [facility]) - Abnormal;  Notable for the following components:   D-Dimer, Quant 1.46 (*)    All other components within normal limits    Otherwise labs showing:   Recent Labs  Lab 07/05/20 1216 07/05/20 2303  NA 139  --   K 3.9  --   CO2 25  --   GLUCOSE 123*  --   BUN 19  --   CREATININE 0.68  --   CALCIUM 8.8*  --   MG  --  1.8  PHOS  --  2.7    Cr   stable,   Lab Results  Component Value Date   CREATININE 0.68 07/05/2020   CREATININE 0.70 09/03/2019   CREATININE 0.81 09/03/2019    Recent Labs  Lab 07/05/20 1216  AST 36  ALT 31  ALKPHOS 56  BILITOT 0.6  PROT 7.2  ALBUMIN 3.2*   Lab Results  Component Value Date   CALCIUM 8.8 (L) 07/05/2020     WBC       Component Value Date/Time   WBC 8.4 07/05/2020 1216   LYMPHSABS 0.4 (L) 07/05/2020 1216   MONOABS 0.6 07/05/2020 1216   EOSABS 0.0 07/05/2020 1216   BASOSABS 0.0 07/05/2020 1216   Plt: Lab Results  Component Value Date   PLT 208 07/05/2020       Procalcitonin  Ordered   COVID-19 Labs  Recent Labs    07/05/20 1216 07/05/20 2303  DDIMER 1.46*  --   LDH  --  276*    Lab Results  Component Value Date   SARSCOV2NAA NEGATIVE 09/03/2019     HG/HCT  Stable,     Component Value Date/Time   HGB 12.7 07/05/2020 1216   HCT 38.7 07/05/2020 1216   MCV 90.8 07/05/2020 1216    No results for input(s): LIPASE, AMYLASE in the last 168 hours. No results for input(s): AMMONIA in the last 168 hours.    Troponin 8-5 Cardiac Panel (last 3 results) No results for input(s): CKTOTAL, CKMB, TROPONINI, RELINDX in the last 72 hours.     ECG: Ordered Personally reviewed by me showing: HR : 88 Rhythm:  NSR,    no evidence of ischemic changes QTC 420   BNP (last 3 results) Recent Labs    07/05/20 1216  BNP 57.5          Ordered   CXR -multifocal pneumonia.    CTA chest -   no PE, bilat infiltrate    ED Triage Vitals  Enc Vitals Group     BP 07/05/20 1111 (!) 119/58     Pulse Rate 07/05/20 1111 94     Resp 07/05/20 1111 17     Temp 07/05/20 1111 98.6 F (37 C)     Temp Source 07/05/20 1111 Oral     SpO2 07/05/20 1111 96 %     Weight 07/05/20 1151 167 lb (75.8 kg)     Height 07/05/20 1151 5\' 5"  (1.651 m)     Head Circumference --      Peak Flow --      Pain Score 07/05/20 1150 8     Pain Loc --      Pain Edu? --      Excl. in [place]? --   TMAX(24)@       Latest  Blood pressure (!) 96/52, pulse 80, temperature 98.5 F (36.9 C), temperature source Oral, resp. rate 16, height 5\' 5"  (1.651 m), weight 75.8 kg, SpO2 92 %.      Review of  Systems:    Pertinent positives include:   Fatigue shortness of breath at rest.   dyspnea on exertion,  Constitutional:   No weight loss, night sweats, Fevers, chills,, weight loss  HEENT:  No headaches, Difficulty swallowing,Tooth/dental problems,Sore throat,  No sneezing, itching, ear ache, nasal congestion, post nasal drip,  Cardio-vascular:  No chest pain, Orthopnea, PND, anasarca, dizziness, palpitations.no Bilateral lower extremity swelling  GI:  No heartburn, indigestion, abdominal pain, nausea, vomiting, diarrhea, change in bowel habits, loss of appetite, melena, blood in stool, hematemesis Resp:  no  No excess mucus, no productive cough, No non-productive cough, No coughing up of blood.No change in color of mucus.No wheezing. Skin:  no rash or lesions. No jaundice GU:  no dysuria, change in color of urine, no urgency or frequency. No straining to urinate.  No flank pain.  Musculoskeletal:  No joint pain or no joint swelling. No decreased range of motion. No back pain.  Psych:  No change in mood or affect. No depression or anxiety. No memory loss.  Neuro: no localizing neurological complaints, no tingling, no weakness, no double vision, no gait abnormality, no slurred speech, no confusion  All systems reviewed and apart from [place] all are negative  Past Medical History:   Past Medical History:  Diagnosis Date  . Arm mass, right   . Bladder incontinence    no meds  . GERD (gastroesophageal reflux disease)   . Hypothyroidism    no meds  . Plantar fasciitis   . Schwannoma      Past Surgical History:  Procedure Laterality Date  . ABDOMINAL HYSTERECTOMY    . ABDOMINOPLASTY  1993  . ANKLE HARDWARE REMOVAL  2010  . APPENDECTOMY    . BLADDER SURGERY  1982  . BREAST REDUCTION SURGERY  1993  . COLON SURGERY    . DILATION AND CURETTAGE OF UTERUS    . MASS EXCISION Right 02/10/2018   Procedure: EXCISION OF RIGHT UPPER ARM MASS;  Surgeon: [name], MD;  Location: [facility];  Service: General;  Laterality: Right;  . ORIF ANKLE FRACTURE    . ORIF RADIAL FRACTURE Right  09/03/2019   Procedure: OPEN REDUCTION INTERNAL FIXATION (ORIF) RADIAL AND ULNAR  FRACTURE;  Surgeon: Verner Mould, MD;  Location: [facility];  Service: Orthopedics;  Laterality: Right;  . RETROMASTOID CRANIOTOMY Left    for schwannoma  . SEPTOPLASTY Bilateral   . TUBAL LIGATION    . VENTRICULOPERITONEAL SHUNT      Social History:  Ambulatory independently      reports that she has quit smoking. She has never used smokeless tobacco. She reports previous alcohol use. She reports that she does not use drugs.     Family History:   Family History  Problem Relation Age of Onset  . Hypertension Other     Allergies: Allergies  Allergen Reactions  . Morphine And Related Nausea And Vomiting     Prior to Admission medications   Medication Sig Start Date End Date Taking? Authorizing Provider  albuterol (VENTOLIN HFA) 108 (90 Base) MCG/ACT inhaler Inhale 2 puffs into the lungs every 6 (six) hours as needed for wheezing or shortness of breath. 06/26/20  Yes [provider]  Ascorbic Acid (VITAMIN C) 1000 MG tablet Take 3,000 mg by mouth daily.   Yes [provider]  azithromycin (ZITHROMAX) 250 MG tablet Take 250 mg by mouth See admin instructions. Zpack for 5 days Start date : 06/30/20 06/26/20  Yes [provider]  Calcium Carb-Cholecalciferol 1000-800 MG-UNIT TABS Take 1 tablet by mouth daily.   Yes [provider]  cholecalciferol (VITAMIN D3) 25 MCG (1000 UNIT) tablet Take 1,000 Units by mouth daily.   Yes [provider]  co-enzyme Q-10 30 MG capsule Take 30 mg by mouth daily.   Yes [provider]  Collagen 500 MG CAPS Take 500 mg by mouth daily.   Yes [provider]  fluticasone (FLONASE) 50 MCG/ACT nasal spray Place 2 sprays into both nostrils daily. 07/03/20  Yes [provider]  Lysine 1000 MG TABS Take 1,000 mg by mouth daily.   Yes [provider]  Magnesium 400 MG CAPS Take 400 mg by mouth daily.    Yes [provider]  Misc Natural Products (GLUCOSAMINE CHONDROITIN ADV PO) Take 1 tablet by mouth daily.   Yes [provider]  Multiple Vitamin (MULTIVITAMIN WITH MINERALS) TABS tablet Take 1 tablet by mouth daily.   Yes [provider]  Omega-3 Fatty Acids (FISH OIL) 1000 MG CAPS Take 1,000 mg by mouth daily.   Yes [provider]  vitamin E 400 UNIT capsule Take 400 Units by mouth daily.   Yes [provider]  zinc gluconate 50 MG tablet Take 50 mg by mouth daily.   Yes [provider]  HYDROcodone-acetaminophen (NORCO/VICODIN) 5-325 MG tablet Take 1-2 tablets by mouth every 4 (four) hours as needed for moderate pain (pain score 4-6). Patient not taking: No sig reported 09/04/19   Ernest Mallick, MD  methocarbamol (ROBAXIN) 500 MG tablet Take 1 tablet (500 mg total) by mouth every 6 (six) hours as needed for muscle spasms. Patient not taking: No sig reported 09/04/19   Cain Saupe III, MD  oxyCODONE (OXY IR/ROXICODONE) 5 MG immediate release tablet Take 1 tablet (5 mg total) by mouth every 6 (six) hours as needed for severe pain. Patient not taking: No sig reported 02/10/18   Almond Lint, MD   Physical Exam: Vitals with BMI 07/05/2020 07/05/2020 07/05/2020  Height - - -  Weight - - -  BMI - - -  Systolic 96 102 92  Diastolic 52 61 66  Pulse 80 80 -    1. General:  in No Acute distress   well  -appearing 2. Psychological: Alert and   Oriented 3. Head/ENT:    Dry Mucous Membranes                          Head Non traumatic, neck supple                           Poor Dentition 4. SKIN:  decreased Skin turgor,  Skin clean Dry and intact no rash 5. Heart: Regular rate and rhythm no  Murmur, no Rub or gallop 6. Lungs:  no wheezes or crackles   7. Abdomen: Soft,  non-tender, Non distended  bowel sounds present 8. Lower extremities: no clubbing, cyanosis, no edema 9. Neurologically Grossly intact, moving all 4 extremities  equally  10. MSK: Normal range of motion  All other LABS:    Recent Labs  Lab 07/05/20 1216  WBC 8.4  NEUTROABS 7.4  HGB 12.7  HCT 38.7  MCV 90.8  PLT 208     Recent Labs  Lab 07/05/20 1216  NA 139  K 3.9  CL 104  CO2 25  GLUCOSE 123*  BUN 19  CREATININE 0.68  CALCIUM 8.8*     Recent Labs  Lab 07/05/20 1216  AST 36  ALT 31  ALKPHOS 56  BILITOT 0.6  PROT 7.2  ALBUMIN 3.2*       Cultures: No results found for: SDES, SPECREQUEST, CULT, REPTSTATUS   Radiological Exams on Admission: CT Angio Chest PE W and/or Wo Contrast  Result Date: 07/05/2020 CLINICAL DATA:  COVID positive 2 weeks prior.  Hypoxia.  Dyspnea. EXAM: CT ANGIOGRAPHY CHEST WITH CONTRAST TECHNIQUE: Multidetector CT imaging of the chest was performed using the standard protocol during bolus administration of intravenous contrast. Multiplanar CT image reconstructions and MIPs were obtained to evaluate the vascular anatomy. CONTRAST:  18mL OMNIPAQUE IOHEXOL 350 MG/ML SOLN COMPARISON:  Chest radiograph from earlier today. 09/03/2019 chest CT. FINDINGS: Cardiovascular: The study is high quality for the evaluation of pulmonary embolism. There are no filling defects in the central, lobar, segmental or subsegmental pulmonary artery branches to suggest acute pulmonary embolism. Mildly atherosclerotic nonaneurysmal thoracic aorta. Top-normal caliber main pulmonary artery (3.1 cm diameter). Normal heart size. No significant pericardial fluid/thickening. Mediastinum/Nodes: No discrete thyroid nodules. Unremarkable esophagus. No pathologically enlarged axillary, mediastinal or hilar lymph nodes. Lungs/Pleura: No pneumothorax. No pleural effusion. Moderate to severe patchy ground-glass opacity with interlobular septal thickening throughout both lungs involving all lung lobes, new. No lung masses or significant pulmonary nodules. Upper abdomen: Large hiatal hernia.  Stomach is nondistended. Musculoskeletal: No aggressive  appearing focal osseous lesions. Mild thoracic spondylosis. Review of the MIP images confirms the above findings. IMPRESSION: 1. No pulmonary embolism. 2. Moderate to severe patchy ground-glass opacity with interlobular septal thickening throughout both lungs, new, compatible with COVID-19 pneumonia. 3. Large hiatal hernia. 4. Aortic Atherosclerosis (ICD10-I70.0). Electronically Signed   By: Ilona Sorrel M.D.   On: 07/05/2020 16:14   DG Chest Portable 1 View  Result Date: 07/05/2020 CLINICAL DATA:  Shortness of breath. EXAM: PORTABLE CHEST 1 VIEW COMPARISON:  September 03, 2019. FINDINGS: The heart size and mediastinal contours are within normal limits. No pneumothorax or pleural effusion is noted. Patchy airspace opacities are noted in both lungs concerning for multifocal pneumonia. The visualized skeletal structures are unremarkable. IMPRESSION: Patchy bilateral airspace opacities are noted concerning for multifocal pneumonia. Electronically Signed   By: Marijo Conception M.D.   On: 07/05/2020 12:51    Chart has been reviewed    Assessment/Plan   75 y.o. female with medical history significant of  hypothyroidism, GERD, schwannoma  COvid 06/21/20  Admitted for covid pneumonia  Present on Admission:  . COVID-19 virus infection -   FROM HOME  WITH KNOWN HX OF COVID19   Immunization status:not immunized    Following concerning LAB/ imaging findings:  COVID-19 Labs   CBC: leukopenia, lymphopenia   ANC/ALC ratio>3.5    CRP, LDH: increased     CXR: hazy bilateral peripheral opacitie  CT chest: GGO     -Following work-up initiated:      sputum cultures  Ordered 07/06/20, Blood cultures  Ordered 07/06/20,   CTA no PE   Following complications noted:   acute respiratory failure with hypoxia - continue oxygen treatment  Plan of treatment: Admit on Airborn Precautions  -given severity of illness initiate steroids solumedrol 0.5mg /kg BID And pharmacy consult for remdesivir -   IF Hypoxia  progresses rapidly  or  requiring high nasal flow   and no contraindications will attempt a trial of  Actemra   currently no indication - Will follow daily d.dimer - Assess  for ability to prone  - Supportive management -Fluid sparing resuscitation  -Provide oxygen as needed currently on   SpO2: 92 % O2 Flow Rate (L/min): 2 L/min - IF d.dimer elvated >5 will increase dose of lovenox   - Consult PCCM if becomes respiratory unstable*   Poor Prognostic factors  75 y.o.  Personal hx of  NON-Vaccinated status    ABS neutrophil to lymphocyte ratio >3.5       Will order Airborne and Contact precautions  Family/ patient prognosis discussion: I have discussed case with the family/ patient  who are aware of their prognosis At this point they would like  (patient) to be full code patient would like to discuss again if she becomes decompensated    The treatment plan and use of medications and known side effects were discussed with patient . It was clearly explained that there is no proven definitive treatment for COVID-19 infection yet. Any medications used here are based on case reports/anecdotal data which are not peer-reviewed and has not been studied using randomized control trials.  Complete risks and long-term side effects are unknown, however in the best clinical judgment they seem to be of some clinical benefit rather than medical risks.  Patient  agree with the treatment plan and want to receive these treatments as indicated.      . Acute respiratory disease due to COVID-19 virus -  this patient has acute respiratory failure with Hypoxia a  O2 saturatio< 90% on RA   Likely due to: COVID pneumonia Provide O2 therapy and titrate as needed  Continuous pulse ox   check Pulse ox with ambulation prior to discharge  may need  TC consult for home O2 set up  Prone if able  flutter valve ordered  Other plan as per orders.  DVT prophylaxis:   Lovenox       Code Status:    Code Status: Prior   DNI   as per patient  I had personally discussed CODE STATUS with patient     Family Communication:   Family not at  Bedside    Disposition Plan:      To home once workup is complete and patient is stable   Following barriers for discharge:                            Hypoxia stable                     Consults called:none   Admission status:  ED Disposition    ED Disposition Condition Comment   Admit  [facility][id number]  Level of Care: Telemetry [5]  Admit to tele based on following criteria: Other see comments  Comments: covid  [facility] if equivalent level of care is available:: No  Covid Evaluation: Confirmed COVID Positive  Diagnosis: Acute respiratory disease due to COVID-19 virus [[id number]][[id number]]  Admitting Physician: Therisa DoyneUTOVA, Korin Setzler [[id number]]  Attending Physician: Therisa DoyneUTOVA, Dominigue Gellner [[id number]]  Estimated length of stay: past midnight tomorrow  Certification:: I certify this patient will need inpatient services for at least 2 midnights        inpatient     I Expect 2 midnight stay secondary to severity of patient's current illness need for inpatient interventions justified by the following:  hemodynamic instability despite optimal treatment   Hypoxia, )   Severe lab/radiological/exam abnormalities including:  covid PNA   That are currently affecting medical management.   I expect  patient to be hospitalized for 2 midnights requiring inpatient medical care.  Patient is at high risk for adverse outcome (such as loss of life or disability) if not treated.  Indication for inpatient stay as follows:    New or worsening hypoxia  Need for IV antivirals    Level of care  tele  indefinitely please discontinue once patient no longer qualifies COVID-19 Labs    Lab Results  Component Value Date   [place] NEGATIVE 09/03/2019     Precautions: admitted as   covid positive Airborne and Contact  precautions    PPE: Used by the provider:   P100  eye Goggles,  Gloves  gown   Dasani Crear 07/06/2020, 12:21 AM    [facility]     after 2 AM please page floor coverage PA If 7AM-7PM, please contact the day team taking care of the patient using [email or web address]   Patient was evaluated in the context of the global COVID-19 pandemic, which necessitated consideration that the patient might be at risk for infection with the SARS-CoV-2 virus that causes COVID-19. Institutional protocols and algorithms that pertain to the evaluation of patients at risk for COVID-19 are in a state of rapid change based on information released by regulatory bodies including the CDC and federal and state organizations. These policies and algorithms were followed during the patient's care.

## 2020-07-06 ENCOUNTER — Encounter (HOSPITAL_COMMUNITY): Payer: Self-pay | Admitting: Internal Medicine

## 2020-07-06 DIAGNOSIS — U071 COVID-19: Secondary | ICD-10-CM | POA: Diagnosis not present

## 2020-07-06 DIAGNOSIS — J069 Acute upper respiratory infection, unspecified: Secondary | ICD-10-CM | POA: Diagnosis not present

## 2020-07-06 LAB — CBC WITH DIFFERENTIAL/PLATELET
Abs Immature Granulocytes: 0.03 10*3/uL (ref 0.00–0.07)
Basophils Absolute: 0 10*3/uL (ref 0.0–0.1)
Basophils Relative: 0 %
Eosinophils Absolute: 0 10*3/uL (ref 0.0–0.5)
Eosinophils Relative: 0 %
HCT: 35.6 % — ABNORMAL LOW (ref 36.0–46.0)
Hemoglobin: 11.8 g/dL — ABNORMAL LOW (ref 12.0–15.0)
Immature Granulocytes: 1 %
Lymphocytes Relative: 12 %
Lymphs Abs: 0.6 10*3/uL — ABNORMAL LOW (ref 0.7–4.0)
MCH: 30 pg (ref 26.0–34.0)
MCHC: 33.1 g/dL (ref 30.0–36.0)
MCV: 90.6 fL (ref 80.0–100.0)
Monocytes Absolute: 0.2 10*3/uL (ref 0.1–1.0)
Monocytes Relative: 4 %
Neutro Abs: 3.9 10*3/uL (ref 1.7–7.7)
Neutrophils Relative %: 83 %
Platelets: 220 10*3/uL (ref 150–400)
RBC: 3.93 MIL/uL (ref 3.87–5.11)
RDW: 12.6 % (ref 11.5–15.5)
WBC: 4.7 10*3/uL (ref 4.0–10.5)
nRBC: 0 % (ref 0.0–0.2)

## 2020-07-06 LAB — COMPREHENSIVE METABOLIC PANEL
ALT: 28 U/L (ref 0–44)
AST: 32 U/L (ref 15–41)
Albumin: 2.8 g/dL — ABNORMAL LOW (ref 3.5–5.0)
Alkaline Phosphatase: 49 U/L (ref 38–126)
Anion gap: 12 (ref 5–15)
BUN: 17 mg/dL (ref 8–23)
CO2: 24 mmol/L (ref 22–32)
Calcium: 8.7 mg/dL — ABNORMAL LOW (ref 8.9–10.3)
Chloride: 104 mmol/L (ref 98–111)
Creatinine, Ser: 0.68 mg/dL (ref 0.44–1.00)
GFR, Estimated: >60 mL/min (ref >60)
Glucose, Bld: 136 mg/dL — ABNORMAL HIGH (ref 70–99)
Potassium: 4.1 mmol/L (ref 3.5–5.1)
Sodium: 140 mmol/L (ref 135–145)
Total Bilirubin: 0.4 mg/dL (ref 0.3–1.2)
Total Protein: 6.8 g/dL (ref 6.5–8.1)

## 2020-07-06 LAB — EXPECTORATED SPUTUM ASSESSMENT W GRAM STAIN, RFLX TO RESP C

## 2020-07-06 LAB — D-DIMER, QUANTITATIVE: D-Dimer, Quant: 1.23 ug/mL-FEU — ABNORMAL HIGH (ref 0.00–0.50)

## 2020-07-06 LAB — C-REACTIVE PROTEIN
CRP: 13.1 mg/dL — ABNORMAL HIGH (ref <1.0)
CRP: 14.4 mg/dL — ABNORMAL HIGH (ref <1.0)

## 2020-07-06 LAB — MAGNESIUM: Magnesium: 2 mg/dL (ref 1.7–2.4)

## 2020-07-06 LAB — FIBRINOGEN: Fibrinogen: 632 mg/dL — ABNORMAL HIGH (ref 210–475)

## 2020-07-06 LAB — PHOSPHORUS: Phosphorus: 3.3 mg/dL (ref 2.5–4.6)

## 2020-07-06 LAB — PROCALCITONIN
Procalcitonin: <0.1 ng/mL
Procalcitonin: <0.1 ng/mL

## 2020-07-06 LAB — FERRITIN
Ferritin: 211 ng/mL (ref 11–307)
Ferritin: 209 ng/mL (ref 11–307)

## 2020-07-06 LAB — ABO/RH: ABO/RH(D): B POS

## 2020-07-06 LAB — SARS CORONAVIRUS 2 BY RT PCR (HOSPITAL ORDER, PERFORMED IN ~~LOC~~ HOSPITAL LAB): SARS Coronavirus 2: POSITIVE — AB

## 2020-07-06 MED ORDER — FLUTICASONE PROPIONATE 50 MCG/ACT NA SUSP
2.0000 | Freq: Every day | NASAL | Status: DC
  Administered 2020-07-06 – 2020-07-07 (×2): 2 via NASAL
  Filled 2020-07-06: qty 16

## 2020-07-06 NOTE — Progress Notes (Signed)
PROGRESS NOTE    Julie Woodward  PYK:[id number] DOB: 1946/04/19 DOA: 07/05/2020 PCP: Carol Ada, MD    Brief Narrative:  Julie Woodward is a 75 year old female with past medical history significant for hypothyroidism, GERD, schwannoma and recently tested positive for Covid-19 on 06/21/2020 with home test who presented to the ED with progressive shortness of breath.  Is unvaccinated against Covid-19.  Patient was seen by her PCP and was prescribed Levaquin and steroids.  Then she received a second round of antibiotics and steroids, but despite treatment continue to have progressive shortness of breath.  Also associated fatigue, nonproductive cough, nausea and loss of taste and smell.  In the ED, temperature 98.6, HR 94, RR 17, BP 119/58, SPO2 96% on 2 L nasal cannula.  Sodium 139, potassium 3.9, chloride 104, CO2 25, BUN 19, creatinine 0.68, AST 36, ALT 31, BNP 57.5, troponin VIII.  WBC 8.4, hemoglobin 12.7, platelets 208.  CRP 13.1, D-dimer 1.46.  Patchy bilateral airspace opacities concerning for multifocal pneumonia.  CT angiogram chest negative for pulmonary embolism, but does note moderate to severe patchy groundglass opacities with interlobar septal thickening bilaterally compatible with Covid-19 pneumonia.  Hospital service consulted for further evaluation and management of acute hypoxic respite failure secondary to Covid-19 viral pneumonia.   Assessment & Plan:   Active Problems:   Acute respiratory disease due to COVID-19 virus   COVID-19 virus infection   Acute hypoxic respiratory failure secondary to acute Covid-19 viral pneumonia during the ongoing Covid 19 Pandemic - POA Patient presenting to ED with progressive shortness of breath associated with fatigue, loss of taste and smell.  Positive Covid-19 home test on 06/29/2020.  Failed outpatient therapy with 2 rounds of antibiotics and steroids.  Was notably hypoxic on presentation requiring supplemental oxygen.  Nonoxygen  dependent at baseline.  Chest x-ray consistent with multifocal pneumonia, CT angiogram chest negative for PE but with moderate/severe patchy groundglass opacities bilaterally. --COVID test: + home test 06/21/2020, PCR pending --CRP 13.1>14.4 --ddimer 1.46>1.23 --Remdesivir, plan 5-day course (Day #2/5) --Solumedrol 38mg  IV q12h --prone for 2-3hrs every 12hrs if able --Continue supplemental oxygen, titrate to maintain SPO2 greater than 92%, on 2L [place] with SPO2 96% --Continue supportive care with albuterol MDI prn, vitamin C, zinc, Tylenol, antitussives --Follow CBC, CMP, D-dimer, and CRP daily --Continue airborne/contact isolation precautions   The treatment plan and use of medications and known side effects were discussed with patient/family. Some of the medications used are based on case reports/anecdotal data.  All other medications being used in the management of COVID-19 based on limited study data.  Complete risks and long-term side effects are unknown, however in the best clinical judgment they seem to be of some benefit.  Patient wanted to proceed with treatment options provided.   DVT prophylaxis: Lovenox   Code Status: Full Code Family Communication: Updated patient's daughter, Jacqlyn Larsen via telephone this morning  Disposition Plan:  Level of care: Telemetry Status is: Inpatient  Remains inpatient appropriate because:Ongoing diagnostic testing needed not appropriate for outpatient work up, Unsafe d/c plan, IV treatments appropriate due to intensity of illness or inability to take PO and Inpatient level of care appropriate due to severity of illness   Dispo: The patient is from: Home              Anticipated d/c is to: Home              Anticipated d/c date is: 2 days  Patient currently is not medically stable to d/c.   Difficult to place patient No   Consultants:   None  Procedures:   None  Antimicrobials:   None   Subjective: Patient seen and examined at  bedside, resting comfortably.  Continues in ED holding area.  Patient states shortness of breath much improved with IV steroids.  Continues on 2 L nasal cannula with SPO2 96%; does not utilize oxygen at baseline.  Updated patient's daughter via telephone this morning.  No other questions or concerns at this time.  Denies headache, no fever/chills/night sweats, no nausea/vomiting/diarrhea, no chest pain, no palpitations, no abdominal pain, no weakness, no paresthesias.  No acute events overnight per nursing staff.  Objective: Vitals:   07/06/20 0530 07/06/20 0630 07/06/20 0739 07/06/20 1044  BP: 103/61 102/65 95/60 (!) 97/54  Pulse: 71 68 74 84  Resp: 20 (!) 21 (!) 25 (!) 22  Temp:    98.5 F (36.9 C)  TempSrc:    Oral  SpO2: 90% 96% 96% 95%  Weight:      Height:       No intake or output data in the 24 hours ending 07/06/20 1220 Filed Weights   07/05/20 1151  Weight: 75.8 kg    Examination:  General exam: Appears calm and comfortable  Respiratory system: Clear to auscultation. Respiratory effort normal.  On 2 L nasal cannula with SPO2 96%. Cardiovascular system: S1 & S2 heard, RRR. No JVD, murmurs, rubs, gallops or clicks. No pedal edema. Gastrointestinal system: Abdomen is nondistended, soft and nontender. No organomegaly or masses felt. Normal bowel sounds heard. Central nervous system: Alert and oriented. No focal neurological deficits. Extremities: Symmetric 5 x 5 power. Skin: No rashes, lesions or ulcers Psychiatry: Judgement and insight appear normal. Mood & affect appropriate.     Data Reviewed: I have personally reviewed following labs and imaging studies  CBC: Recent Labs  Lab 07/05/20 1216 07/06/20 0425  WBC 8.4 4.7  NEUTROABS 7.4 3.9  HGB 12.7 11.8*  HCT 38.7 35.6*  MCV 90.8 90.6  PLT 208 841   Basic Metabolic Panel: Recent Labs  Lab 07/05/20 1216 07/05/20 2303 07/06/20 0425  NA 139  --  140  K 3.9  --  4.1  CL 104  --  104  CO2 25  --  24  GLUCOSE  123*  --  136*  BUN 19  --  17  CREATININE 0.68  --  0.68  CALCIUM 8.8*  --  8.7*  MG  --  1.8 2.0  PHOS  --  2.7 3.3   GFR: Estimated Creatinine Clearance: 62.8 mL/min (by C-G formula based on SCr of 0.68 mg/dL). Liver Function Tests: Recent Labs  Lab 07/05/20 1216 07/06/20 0425  AST 36 32  ALT 31 28  ALKPHOS 56 49  BILITOT 0.6 0.4  PROT 7.2 6.8  ALBUMIN 3.2* 2.8*   No results for input(s): LIPASE, AMYLASE in the last 168 hours. No results for input(s): AMMONIA in the last 168 hours. Coagulation Profile: No results for input(s): INR, PROTIME in the last 168 hours. Cardiac Enzymes: No results for input(s): CKTOTAL, CKMB, CKMBINDEX, TROPONINI in the last 168 hours. BNP (last 3 results) No results for input(s): PROBNP in the last 8760 hours. HbA1C: No results for input(s): HGBA1C in the last 72 hours. CBG: No results for input(s): GLUCAP in the last 168 hours. Lipid Profile: No results for input(s): CHOL, HDL, LDLCALC, TRIG, CHOLHDL, LDLDIRECT in the last 72 hours. Thyroid  Function Tests: No results for input(s): TSH, T4TOTAL, FREET4, T3FREE, THYROIDAB in the last 72 hours. Anemia Panel: Recent Labs    07/05/20 2303 07/06/20 0425  FERRITIN 211 209   Sepsis Labs: Recent Labs  Lab 07/05/20 2303 07/06/20 0425  PROCALCITON <0.10 <0.10    Recent Results (from the past 240 hour(s))  Culture, sputum-assessment     Status: None   Collection Time: 07/05/20 11:59 PM   Specimen: Expectorated Sputum  Result Value Ref Range Status   Specimen Description EXPSU  Final   Special Requests NONE  Final   Sputum evaluation   Final    THIS SPECIMEN IS ACCEPTABLE FOR SPUTUM CULTURE Performed at [facility], [address] [place], [place] [address]    Report Status 07/06/2020 FINAL  Final  Culture, respiratory     Status: None (Preliminary result)   Collection Time: 07/05/20 11:59 PM  Result Value Ref Range Status   Specimen Description   Final     EXPSU Performed at [facility], [address] [place], [place] [address]    Special Requests   Final    NONE Reflexed from [id number] Performed at [facility], [address] [place], [place] [address]    Gram Stain   Final    MODERATE WBC PRESENT, PREDOMINANTLY PMN MODERATE YEAST MODERATE GRAM POSITIVE COCCI IN PAIRS IN CHAINS FEW GRAM NEGATIVE COCCOBACILLI Performed at [facility], [address] [place], [place] [address]    Culture PENDING  Incomplete   Report Status PENDING  Incomplete         Radiology Studies: CT Angio Chest PE W and/or Wo Contrast  Result Date: 07/05/2020 CLINICAL DATA:  COVID positive 2 weeks prior.  Hypoxia.  Dyspnea. EXAM: CT ANGIOGRAPHY CHEST WITH CONTRAST TECHNIQUE: Multidetector CT imaging of the chest was performed using the standard protocol during bolus administration of intravenous contrast. Multiplanar CT image reconstructions and MIPs were obtained to evaluate the vascular anatomy. CONTRAST:  75mL OMNIPAQUE IOHEXOL 350 MG/ML SOLN COMPARISON:  Chest radiograph from earlier today. 09/03/2019 chest CT. FINDINGS: Cardiovascular: The study is high quality for the evaluation of pulmonary embolism. There are no filling defects in the central, lobar, segmental or subsegmental pulmonary artery branches to suggest acute pulmonary embolism. Mildly atherosclerotic nonaneurysmal thoracic aorta. Top-normal caliber main pulmonary artery (3.1 cm diameter). Normal heart size. No significant pericardial fluid/thickening. Mediastinum/Nodes: No discrete thyroid nodules. Unremarkable esophagus. No pathologically enlarged axillary, mediastinal or hilar lymph nodes. Lungs/Pleura: No pneumothorax. No pleural effusion. Moderate to severe patchy ground-glass opacity with interlobular septal thickening throughout both lungs involving all lung lobes, new. No lung masses or significant pulmonary nodules. Upper abdomen: Large hiatal  hernia.  Stomach is nondistended. Musculoskeletal: No aggressive appearing focal osseous lesions. Mild thoracic spondylosis. Review of the MIP images confirms the above findings. IMPRESSION: 1. No pulmonary embolism. 2. Moderate to severe patchy ground-glass opacity with interlobular septal thickening throughout both lungs, new, compatible with COVID-19 pneumonia. 3. Large hiatal hernia. 4. Aortic Atherosclerosis (ICD10-I70.0). Electronically Signed   By: Delbert PhenixJason A Poff M.D.   On: 07/05/2020 16:14   DG Chest Portable 1 View  Result Date: 07/05/2020 CLINICAL DATA:  Shortness of breath. EXAM: PORTABLE CHEST 1 VIEW COMPARISON:  September 03, 2019. FINDINGS: The heart size and mediastinal contours are within normal limits. No pneumothorax or pleural effusion is noted. Patchy airspace opacities are noted in both lungs concerning for multifocal pneumonia. The visualized skeletal structures are unremarkable. IMPRESSION:  Patchy bilateral airspace opacities are noted concerning for multifocal pneumonia. Electronically Signed   By: Marijo Conception M.D.   On: 07/05/2020 12:51        Scheduled Meds: . albuterol  2 puff Inhalation Q6H  . enoxaparin (LOVENOX) injection  40 mg Subcutaneous Q24H  . methylPREDNISolone (SOLU-MEDROL) injection  0.5 mg/kg Intravenous Q12H   Followed by  . [START ON 07/09/2020] predniSONE  50 mg Oral Daily  . sodium chloride flush  3 mL Intravenous Q12H  . sodium chloride flush  3 mL Intravenous Q12H   Continuous Infusions: . sodium chloride    . remdesivir 100 mg in NS 100 mL       LOS: 1 day    Time spent: 39 minutes spent on chart review, discussion with nursing staff, consultants, updating family and interview/physical exam; more than 50% of that time was spent in counseling and/or coordination of care.    Zadiel Leyh J [place] DO [facility] Available via Epic secure chat 7am-7pm After these hours, please refer to coverage provider listed on [email or web address] 07/06/2020, 12:20 PM

## 2020-07-06 NOTE — ED Notes (Signed)
Patient received lunch tray 

## 2020-07-06 NOTE — ED Notes (Signed)
Patient received breakfast tray 

## 2020-07-06 NOTE — ED Notes (Signed)
Assisted patient to bedside commode

## 2020-07-06 NOTE — Progress Notes (Signed)
Pt arrived to [place] around 1545 to room 1526. Pt is alert and Ox4. She was oriented to the floor.

## 2020-07-06 NOTE — ED Notes (Signed)
CRITICAL VALUE ALERT  Critical Value:  COVID Positive  Date & Time Notied:  07/06/2020 1328  Provider Notified: [place] DO  Orders Received/Actions taken: DO aware

## 2020-07-07 DIAGNOSIS — U071 COVID-19: Secondary | ICD-10-CM | POA: Diagnosis not present

## 2020-07-07 DIAGNOSIS — J069 Acute upper respiratory infection, unspecified: Secondary | ICD-10-CM | POA: Diagnosis not present

## 2020-07-07 LAB — CBC WITH DIFFERENTIAL/PLATELET
Abs Immature Granulocytes: 0.05 10*3/uL (ref 0.00–0.07)
Basophils Absolute: 0 10*3/uL (ref 0.0–0.1)
Basophils Relative: 0 %
Eosinophils Absolute: 0 10*3/uL (ref 0.0–0.5)
Eosinophils Relative: 0 %
HCT: 36.8 % (ref 36.0–46.0)
Hemoglobin: 11.9 g/dL — ABNORMAL LOW (ref 12.0–15.0)
Immature Granulocytes: 1 %
Lymphocytes Relative: 12 %
Lymphs Abs: 0.9 10*3/uL (ref 0.7–4.0)
MCH: 29.8 pg (ref 26.0–34.0)
MCHC: 32.3 g/dL (ref 30.0–36.0)
MCV: 92 fL (ref 80.0–100.0)
Monocytes Absolute: 0.4 10*3/uL (ref 0.1–1.0)
Monocytes Relative: 6 %
Neutro Abs: 5.7 10*3/uL (ref 1.7–7.7)
Neutrophils Relative %: 81 %
Platelets: 250 10*3/uL (ref 150–400)
RBC: 4 MIL/uL (ref 3.87–5.11)
RDW: 12.4 % (ref 11.5–15.5)
WBC: 7 10*3/uL (ref 4.0–10.5)
nRBC: 0 % (ref 0.0–0.2)

## 2020-07-07 LAB — COMPREHENSIVE METABOLIC PANEL
ALT: 37 U/L (ref 0–44)
AST: 33 U/L (ref 15–41)
Albumin: 2.8 g/dL — ABNORMAL LOW (ref 3.5–5.0)
Alkaline Phosphatase: 47 U/L (ref 38–126)
Anion gap: 8 (ref 5–15)
BUN: 18 mg/dL (ref 8–23)
CO2: 22 mmol/L (ref 22–32)
Calcium: 8.6 mg/dL — ABNORMAL LOW (ref 8.9–10.3)
Chloride: 107 mmol/L (ref 98–111)
Creatinine, Ser: 0.56 mg/dL (ref 0.44–1.00)
GFR, Estimated: >60 mL/min (ref >60)
Glucose, Bld: 152 mg/dL — ABNORMAL HIGH (ref 70–99)
Potassium: 4.2 mmol/L (ref 3.5–5.1)
Sodium: 137 mmol/L (ref 135–145)
Total Bilirubin: 0.4 mg/dL (ref 0.3–1.2)
Total Protein: 6.3 g/dL — ABNORMAL LOW (ref 6.5–8.1)

## 2020-07-07 LAB — D-DIMER, QUANTITATIVE: D-Dimer, Quant: 0.86 ug/mL-FEU — ABNORMAL HIGH (ref 0.00–0.50)

## 2020-07-07 LAB — MAGNESIUM: Magnesium: 2 mg/dL (ref 1.7–2.4)

## 2020-07-07 LAB — C-REACTIVE PROTEIN: CRP: 7.8 mg/dL — ABNORMAL HIGH (ref <1.0)

## 2020-07-07 MED ORDER — PREDNISONE 5 MG PO TABS: ORAL_TABLET | ORAL | 0 refills | Status: AC

## 2020-07-07 NOTE — Progress Notes (Signed)
SATURATION QUALIFICATIONS: (This note is used to comply with regulatory documentation for home oxygen)  Patient Saturations on Room Air at Rest = 91%  Patient Saturations on Room Air while Ambulating = 90%  Patient Saturations on NA Liters of oxygen while Ambulating = NA%  Please briefly explain why patient needs home oxygen:  Patient stable for d/c home without O2.  Magda Kiel, PT Acute Rehabilitation Services Pager:[phone] Office:[phone] 07/07/2020

## 2020-07-07 NOTE — Plan of Care (Signed)
  Problem: Education: Goal: Knowledge of risk factors and measures for prevention of condition will improve Outcome: Adequate for Discharge   Problem: Respiratory: Goal: Will maintain a patent airway Outcome: Adequate for Discharge Goal: Complications related to the disease process, condition or treatment will be avoided or minimized Outcome: Adequate for Discharge

## 2020-07-07 NOTE — Evaluation (Signed)
Physical Therapy Evaluation Patient Details Name: Julie Woodward MRN: [id number] DOB: 1946-05-10 Today's Date: 07/07/2020   History of Present Illness  Julie Woodward is a 75 y.o. female with medical history significant of  hypothyroidism, GERD, schwannoma   Covid 06/21/20 and admitted 07/05/20 due to SOB & hypoxia.  Clinical Impression  Patient presents close to her functional baseline.  She demonstrated in room ambulation and performed several balance tasks independently.  Feel she will be safe for d/c home with spouse and no PT follow up.  Plans for d/c home today. Noted SpO2 90% or higher with ambulation on RA.  RN aware.    Follow Up Recommendations No PT follow up    Equipment Recommendations  None recommended by PT    Recommendations for Other Services       Precautions / Restrictions Precautions Precautions: None      Mobility  Bed Mobility               General bed mobility comments: sitting up on EOB    Transfers Overall transfer level: Independent                  Ambulation/Gait Ambulation/Gait assistance: Independent Gait Distance (Feet): 120 Feet Assistive device: None Gait Pattern/deviations: WFL(Within Functional Limits);Step-through pattern     General Gait Details: ambulated in room back and forth to doorway, no device, gait basically WNL  Stairs            Wheelchair Mobility    Modified Rankin (Stroke Patients Only)       Balance Overall balance assessment: Independent                               Standardized Balance Assessment Standardized Balance Assessment : Berg Balance Test Berg Balance Test Sit to Stand: Able to stand  independently using hands Standing Unsupported: Able to stand safely 2 minutes Sitting with Back Unsupported but Feet Supported on Floor or Stool: Able to sit safely and securely 2 minutes Stand to Sit: Sits safely with minimal use of hands Standing Unsupported with Eyes Closed:  Able to stand 10 seconds safely Standing Ubsupported with Feet Together: Able to place feet together independently and stand 1 minute safely From Standing, Reach Forward with Outstretched Arm: Can reach confidently >25 cm (10") From Standing Position, Pick up Object from Floor: Able to pick up shoe safely and easily From Standing Position, Turn to Look Behind Over each Shoulder: Looks behind from both sides and weight shifts well Turn 360 Degrees: Able to turn 360 degrees safely in 4 seconds or less Standing Unsupported, Alternately Place Feet on Step/Stool: Able to stand independently and safely and complete 8 steps in 20 seconds Standing Unsupported, One Foot in Front: Able to place foot tandem independently and hold 30 seconds         Pertinent Vitals/Pain Pain Assessment: No/denies pain    Home Living Family/patient expects to be discharged to:: Private residence Living Arrangements: Spouse/significant other Available Help at Discharge: Family;Available 24 hours/day Type of Home: House Home Access: Level entry     Home Layout: Two level;Bed/bath upstairs Home Equipment: Walker - 2 wheels;Cane - quad      Prior Function Level of Independence: Independent         Comments: Pt reports she was fully independent PA      Hand Dominance        Extremity/Trunk Assessment  Upper Extremity Assessment Upper Extremity Assessment: Overall WFL for tasks assessed    Lower Extremity Assessment Lower Extremity Assessment: Overall WFL for tasks assessed       Communication   Communication: No difficulties  Cognition Arousal/Alertness: Awake/alert Behavior During Therapy: WFL for tasks assessed/performed Overall Cognitive Status: Within Functional Limits for tasks assessed                                        General Comments      Exercises     Assessment/Plan    PT Assessment Patent does not need any further PT services  PT Problem List          PT Treatment Interventions      PT Goals (Current goals can be found in the Care Plan section)  Acute Rehab PT Goals PT Goal Formulation: All assessment and education complete, DC therapy    Frequency     Barriers to discharge        Co-evaluation               AM-PAC PT "6 Clicks" Mobility  Outcome Measure Help needed turning from your back to your side while in a flat bed without using bedrails?: None Help needed moving from lying on your back to sitting on the side of a flat bed without using bedrails?: None Help needed moving to and from a bed to a chair (including a wheelchair)?: None Help needed standing up from a chair using your arms (e.g., wheelchair or bedside chair)?: None Help needed to walk in hospital room?: None Help needed climbing 3-5 steps with a railing? : None 6 Click Score: 24    End of Session   Activity Tolerance: Patient tolerated treatment well Patient left: in bed;with call bell/phone within reach (sitting EOB)   PT Visit Diagnosis: Muscle weakness (generalized) (M62.81)    Time: 0623-7628 PT Time Calculation (min) (ACUTE ONLY): 24 min   Charges:   PT Evaluation $PT Eval Low Complexity: 1 Low PT Treatments $Gait Training: 8-22 mins        Julie Woodward, PT Acute Rehabilitation Services [phone] Office:[phone] 07/07/2020   Julie Woodward 07/07/2020, 12:07 PM

## 2020-07-07 NOTE — Progress Notes (Signed)
The patient is scheduled for a Remdesivir infusion on 2/7 and 2/8 at 1030AM. Have the patient come to [place], they will see a COVID infusion banner by the road.  Enter there and turn left. There are marked spaces for Infusion.  Call the number on the sign or [phone] and someone will come out and bring them inside.  If someone is driving them, have them come to the same area and call the number and someone will come outside to get them. Thank you!

## 2020-07-07 NOTE — Discharge Summary (Signed)
Physician Discharge Summary  Julie Woodward CXK:[id number] DOB: [birth year] DOA: 07/05/2020  PCP: Carol Ada, MD  Admit date: 07/05/2020 Discharge date: 07/07/2020  Admitted From: Home Disposition: Home  Recommendations for Outpatient Follow-up:  1. Follow up with PCP in 1-2 weeks 2. Continue prednisone taper on discharge 3. Albuterol MDI as needed 4. Patient will continue remdesivir infusions outpatient, scheduled on 07/09/2020 and 07/10/2020 at 10:30 AM at the outpatient Covid infusion center at [place]: No Equipment/Devices: None  Discharge Condition: Stable CODE STATUS: Full code Diet recommendation: Regular diet  History of present illness:  Julie Woodward is a 75 year old female with past medical history significant for hypothyroidism, GERD, schwannoma and recently tested positive for Covid-19 on 06/21/2020 with home test who presented to the ED with progressive shortness of breath.  Is unvaccinated against Covid-19.  Patient was seen by her PCP and was prescribed Levaquin and steroids.  Then she received a second round of antibiotics and steroids, but despite treatment continue to have progressive shortness of breath.  Also associated fatigue, nonproductive cough, nausea and loss of taste and smell.  In the ED, temperature 98.6, HR 94, RR 17, BP 119/58, SPO2 96% on 2 L nasal cannula.  Sodium 139, potassium 3.9, chloride 104, CO2 25, BUN 19, creatinine 0.68, AST 36, ALT 31, BNP 57.5, troponin VIII.  WBC 8.4, hemoglobin 12.7, platelets 208.  CRP 13.1, D-dimer 1.46.  Patchy bilateral airspace opacities concerning for multifocal pneumonia.  CT angiogram chest negative for pulmonary embolism, but does note moderate to severe patchy groundglass opacities with interlobar septal thickening bilaterally compatible with Covid-19 pneumonia.  Hospital service consulted for further evaluation and management of acute hypoxic respite failure secondary to Covid-19 viral  pneumonia.  Hospital course:  Acute hypoxic respiratory failure secondary to acute Covid-19 viral pneumonia during the ongoing Covid 19 Pandemic - POA Patient presenting to ED with progressive shortness of breath associated with fatigue, loss of taste and smell.  Positive Covid-19 home test on 06/29/2020.  Failed outpatient therapy with 2 rounds of antibiotics and steroids.  Was notably hypoxic on presentation requiring supplemental oxygen.  Nonoxygen dependent at baseline.  Chest x-ray consistent with multifocal pneumonia, CT angiogram chest negative for PE but with moderate/severe patchy groundglass opacities bilaterally. COVID test: + home test 06/21/2020, and repeat positive on 07/06/2020. CRP 13.1 and D-dimer elevated 1.46 on admission.  Patient was started on IV steroids and remdesivir with improvement of inflammatory markers.  Patient was able to be titrated off of supplemental oxygen with no hypoxia with exertion.  Patient wishes to discharge home to complete remdesivir infusion outpatient, scheduled on 2/7 and 07/10/2020 at 10:30 AM at the [facility] outpatient infusion center.  We will continue prednisone taper on discharge.  Continue home isolation precautions in accordance with CDC guidelines.  The treatment plan and use of medications and known side effects were discussed with patient/family. Some of the medications used are based on case reports/anecdotal data.  All other medications being used in the management of COVID-19 based on limited study data.  Complete risks and long-term side effects are unknown, however in the best clinical judgment they seem to be of some benefit.  Patient wanted to proceed with treatment options provided.  Discharge Diagnoses:  Active Problems:   Acute respiratory disease due to COVID-19 virus   COVID-19 virus infection    Discharge Instructions  Discharge Instructions    Call MD for:  difficulty breathing, headache or visual disturbances  Complete by: As  directed    Call MD for:  extreme fatigue   Complete by: As directed    Call MD for:  persistant dizziness or light-headedness   Complete by: As directed    Call MD for:  persistant nausea and vomiting   Complete by: As directed    Call MD for:  severe uncontrolled pain   Complete by: As directed    Call MD for:  temperature >100.4   Complete by: As directed    Diet - low sodium heart healthy   Complete by: As directed    Increase activity slowly   Complete by: As directed      Allergies as of 07/07/2020      Reactions   Morphine And Related Nausea And Vomiting      Medication List    STOP taking these medications   azithromycin 250 MG tablet Commonly known as: ZITHROMAX   HYDROcodone-acetaminophen 5-325 MG tablet Commonly known as: NORCO/VICODIN   methocarbamol 500 MG tablet Commonly known as: ROBAXIN   oxyCODONE 5 MG immediate release tablet Commonly known as: Oxy IR/ROXICODONE     TAKE these medications   albuterol 108 (90 Base) MCG/ACT inhaler Commonly known as: VENTOLIN HFA Inhale 2 puffs into the lungs every 6 (six) hours as needed for wheezing or shortness of breath.   Calcium Carb-Cholecalciferol 1000-800 MG-UNIT Tabs Take 1 tablet by mouth daily.   cholecalciferol 25 MCG (1000 UNIT) tablet Commonly known as: VITAMIN D3 Take 1,000 Units by mouth daily.   co-enzyme Q-10 30 MG capsule Take 30 mg by mouth daily.   Collagen 500 MG Caps Take 500 mg by mouth daily.   Fish Oil 1000 MG Caps Take 1,000 mg by mouth daily.   fluticasone 50 MCG/ACT nasal spray Commonly known as: FLONASE Place 2 sprays into both nostrils daily.   GLUCOSAMINE CHONDROITIN ADV PO Take 1 tablet by mouth daily.   Lysine 1000 MG Tabs Take 1,000 mg by mouth daily.   Magnesium 400 MG Caps Take 400 mg by mouth daily.   multivitamin with minerals Tabs tablet Take 1 tablet by mouth daily.   predniSONE 5 MG tablet Commonly known as: DELTASONE Take 8 tablets (40 mg total) by  mouth daily with breakfast for 3 days, THEN 6 tablets (30 mg total) daily with breakfast for 3 days, THEN 4 tablets (20 mg total) daily with breakfast for 3 days, THEN 2 tablets (10 mg total) daily with breakfast for 3 days, THEN 1 tablet (5 mg total) daily with breakfast for 3 days. Start taking on: July 07, 2020   vitamin C 1000 MG tablet Take 3,000 mg by mouth daily.   vitamin E 180 MG (400 UNITS) capsule Take 400 Units by mouth daily.   zinc gluconate 50 MG tablet Take 50 mg by mouth daily.       Follow-up Information    Carol Ada, MD. Schedule an appointment as soon as possible for a visit in 1 week(s).   Specialty: Family Medicine Contact information: [place] [id number] [phone]              Allergies  Allergen Reactions  . Morphine And Related Nausea And Vomiting    Consultations:  None   Procedures/Studies: CT Angio Chest PE W and/or Wo Contrast  Result Date: 07/05/2020 CLINICAL DATA:  COVID positive 2 weeks prior.  Hypoxia.  Dyspnea. EXAM: CT ANGIOGRAPHY CHEST WITH CONTRAST TECHNIQUE: Multidetector CT imaging of  the chest was performed using the standard protocol during bolus administration of intravenous contrast. Multiplanar CT image reconstructions and MIPs were obtained to evaluate the vascular anatomy. CONTRAST:  5mL OMNIPAQUE IOHEXOL 350 MG/ML SOLN COMPARISON:  Chest radiograph from earlier today. 09/03/2019 chest CT. FINDINGS: Cardiovascular: The study is high quality for the evaluation of pulmonary embolism. There are no filling defects in the central, lobar, segmental or subsegmental pulmonary artery branches to suggest acute pulmonary embolism. Mildly atherosclerotic nonaneurysmal thoracic aorta. Top-normal caliber main pulmonary artery (3.1 cm diameter). Normal heart size. No significant pericardial fluid/thickening. Mediastinum/Nodes: No discrete thyroid nodules. Unremarkable esophagus. No pathologically  enlarged axillary, mediastinal or hilar lymph nodes. Lungs/Pleura: No pneumothorax. No pleural effusion. Moderate to severe patchy ground-glass opacity with interlobular septal thickening throughout both lungs involving all lung lobes, new. No lung masses or significant pulmonary nodules. Upper abdomen: Large hiatal hernia.  Stomach is nondistended. Musculoskeletal: No aggressive appearing focal osseous lesions. Mild thoracic spondylosis. Review of the MIP images confirms the above findings. IMPRESSION: 1. No pulmonary embolism. 2. Moderate to severe patchy ground-glass opacity with interlobular septal thickening throughout both lungs, new, compatible with COVID-19 pneumonia. 3. Large hiatal hernia. 4. Aortic Atherosclerosis (ICD10-I70.0). Electronically Signed   By: Ilona Sorrel M.D.   On: 07/05/2020 16:14   DG Chest Portable 1 View  Result Date: 07/05/2020 CLINICAL DATA:  Shortness of breath. EXAM: PORTABLE CHEST 1 VIEW COMPARISON:  September 03, 2019. FINDINGS: The heart size and mediastinal contours are within normal limits. No pneumothorax or pleural effusion is noted. Patchy airspace opacities are noted in both lungs concerning for multifocal pneumonia. The visualized skeletal structures are unremarkable. IMPRESSION: Patchy bilateral airspace opacities are noted concerning for multifocal pneumonia. Electronically Signed   By: Marijo Conception M.D.   On: 07/05/2020 12:51      Subjective: Patient seen and examined bedside, sitting on bedside couch eating breakfast.  Has been titrated off of Suboxone.  States ready for discharge home.  Discussed with patient extensively regarding outpatient follow-up including infusion center appointments for remdesivir and continued prednisone taper on discharge.  No other questions or concerns at this time.  Denies headache, no fever/chills/night sweats, no nausea/vomiting/diarrhea, no chest pain, no palpitations, no shortness of breath, no abdominal pain, no weakness, no  fatigue, no paresthesias.  No acute events overnight per nurse staff.  Discharge Exam: Vitals:   07/06/20 2346 07/07/20 0403  BP: (!) 117/57 109/62  Pulse: 82 80  Resp: 20 16  Temp: 98.2 F (36.8 C) 97.7 F (36.5 C)  SpO2: 96% 97%   Vitals:   07/06/20 1545 07/06/20 2006 07/06/20 2346 07/07/20 0403  BP: 118/66 120/74 (!) 117/57 109/62  Pulse: 80 78 82 80  Resp: 19 16 20 16   Temp: 97.9 F (36.6 C) 97.7 F (36.5 C) 98.2 F (36.8 C) 97.7 F (36.5 C)  TempSrc: Oral Oral Oral Oral  SpO2: 96% 91% 96% 97%  Weight:      Height:        General: Pt is alert, awake, not in acute distress Cardiovascular: RRR, S1/S2 +, no rubs, no gallops Respiratory: CTA bilaterally, no wheezing, no rhonchi, oxygen well on room air. Abdominal: Soft, NT, ND, bowel sounds + Extremities: no edema, no cyanosis    The results of significant diagnostics from this hospitalization (including imaging, microbiology, ancillary and laboratory) are listed below for reference.     Microbiology: Recent Results (from the past 240 hour(s))  Culture, sputum-assessment     Status:  None   Collection Time: 07/05/20 11:59 PM   Specimen: Expectorated Sputum  Result Value Ref Range Status   Specimen Description EXPSU  Final   Special Requests NONE  Final   Sputum evaluation   Final    THIS SPECIMEN IS ACCEPTABLE FOR SPUTUM CULTURE Performed at [facility], [place] [address], [place], [place] [address]    Report Status 07/06/2020 FINAL  Final  Culture, respiratory     Status: None (Preliminary result)   Collection Time: 07/05/20 11:59 PM  Result Value Ref Range Status   Specimen Description   Final    EXPSU Performed at [facility], [place] [address] [place], [place] [address]    Special Requests   Final    NONE Reflexed from [id number] Performed at [facility], [place] [address], [place], [place] [address]    Gram Stain   Final    MODERATE WBC PRESENT,  PREDOMINANTLY PMN MODERATE YEAST MODERATE GRAM POSITIVE COCCI IN PAIRS IN CHAINS FEW GRAM NEGATIVE COCCOBACILLI    Culture   Final    CULTURE REINCUBATED FOR BETTER GROWTH Performed at [facility], [place] [address] [place], [place] [address]    Report Status PENDING  Incomplete  SARS Coronavirus 2 by RT PCR (hospital order, performed in [facility] hospital lab) Nasopharyngeal Nasopharyngeal Swab     Status: Abnormal   Collection Time: 07/06/20 11:45 AM   Specimen: Nasopharyngeal Swab  Result Value Ref Range Status   SARS Coronavirus 2 POSITIVE (A) NEGATIVE Final    Comment: RESULT CALLED TO, READ BACK BY AND VERIFIED WITH: THOMPSON,C. RN AT [id number] 07/06/20 MULLINS,T (NOTE) SARS-CoV-2 target nucleic acids are DETECTED  SARS-CoV-2 RNA is generally detectable in upper respiratory specimens  during the acute phase of infection.  Positive results are indicative  of the presence of the identified virus, but do not rule out bacterial infection or co-infection with other pathogens not detected by the test.  Clinical correlation with patient history and  other diagnostic information is necessary to determine patient infection status.  The expected result is negative.  Fact Sheet for Patients:   StrictlyIdeas.no   Fact Sheet for Healthcare Providers:   BankingDealers.co.za    This test is not yet approved or cleared by the [place] FDA and  has been authorized for detection and/or diagnosis of SARS-CoV-2 by FDA under an Emergency Use Authorization (EUA).  This EUA will remain in effect (meaning  this test can be used) for the duration of  the COVID-19 declaration under Section 564(b)(1) of the Act, 21 U.S.C. section 360-bbb-3(b)(1), unless the authorization is terminated or revoked sooner.  Performed at [facility], [place] [address] [place],  [address]      Labs: BNP (last 3 results) Recent Labs     07/05/20 1216  BNP [id number]   Basic Metabolic Panel: Recent Labs  Lab 07/05/20 1216 07/05/20 2303 07/06/20 0425 07/07/20 0411  NA 139  --  140 137  K 3.9  --  4.1 4.2  CL 104  --  104 107  CO2 25  --  24 22  GLUCOSE 123*  --  136* 152*  BUN 19  --  17 18  CREATININE 0.68  --  0.68 0.56  CALCIUM 8.8*  --  8.7* 8.6*  MG  --  1.8 2.0 2.0  PHOS  --  2.7 3.3  --    Liver Function Tests: Recent Labs  Lab 07/05/20  1216 07/06/20 0425 07/07/20 0411  AST 36 32 33  ALT 31 28 37  ALKPHOS 56 49 47  BILITOT 0.6 0.4 0.4  PROT 7.2 6.8 6.3*  ALBUMIN 3.2* 2.8* 2.8*   No results for input(s): LIPASE, AMYLASE in the last 168 hours. No results for input(s): AMMONIA in the last 168 hours. CBC: Recent Labs  Lab 07/05/20 1216 07/06/20 0425 07/07/20 0411  WBC 8.4 4.7 7.0  NEUTROABS 7.4 3.9 5.7  HGB 12.7 11.8* 11.9*  HCT 38.7 35.6* 36.8  MCV 90.8 90.6 92.0  PLT 208 220 250   Cardiac Enzymes: No results for input(s): CKTOTAL, CKMB, CKMBINDEX, TROPONINI in the last 168 hours. BNP: Invalid input(s): POCBNP CBG: No results for input(s): GLUCAP in the last 168 hours. D-Dimer Recent Labs    07/06/20 0425 07/07/20 0411  DDIMER 1.23* 0.86*   Hgb A1c No results for input(s): HGBA1C in the last 72 hours. Lipid Profile No results for input(s): CHOL, HDL, LDLCALC, TRIG, CHOLHDL, LDLDIRECT in the last 72 hours. Thyroid function studies No results for input(s): TSH, T4TOTAL, T3FREE, THYROIDAB in the last 72 hours.  Invalid input(s): FREET3 Anemia work up Recent Labs    07/05/20 2303 07/06/20 0425  FERRITIN 211 209   Urinalysis    Component Value Date/Time   COLORURINE STRAW (A) 09/03/2019 [place] 09/03/2019 1606   LABSPEC 1.038 (H) 09/03/2019 1606   PHURINE 7.0 09/03/2019 1606   GLUCOSEU NEGATIVE 09/03/2019 1606   HGBUR NEGATIVE 09/03/2019 1606   BILIRUBINUR NEGATIVE 09/03/2019 1606   KETONESUR 20 (A) 09/03/2019 1606   PROTEINUR NEGATIVE 09/03/2019  1606   NITRITE NEGATIVE 09/03/2019 1606   LEUKOCYTESUR TRACE (A) 09/03/2019 1606   Sepsis Labs Invalid input(s): PROCALCITONIN,  WBC,  LACTICIDVEN Microbiology Recent Results (from the past 240 hour(s))  Culture, sputum-assessment     Status: None   Collection Time: 07/05/20 11:59 PM   Specimen: Expectorated Sputum  Result Value Ref Range Status   Specimen Description EXPSU  Final   Special Requests NONE  Final   Sputum evaluation   Final    THIS SPECIMEN IS ACCEPTABLE FOR SPUTUM CULTURE Performed at [facility], [place] [address], [place], [place] [address]    Report Status 07/06/2020 FINAL  Final  Culture, respiratory     Status: None (Preliminary result)   Collection Time: 07/05/20 11:59 PM  Result Value Ref Range Status   Specimen Description   Final    EXPSU Performed at [facility], [place] [address], [place], [place] [address]    Special Requests   Final    NONE Reflexed from [id number] Performed at [facility], [place] [address], [place], [place] [address]    Gram Stain   Final    MODERATE WBC PRESENT, PREDOMINANTLY PMN MODERATE YEAST MODERATE GRAM POSITIVE COCCI IN PAIRS IN CHAINS FEW GRAM NEGATIVE COCCOBACILLI    Culture   Final    CULTURE REINCUBATED FOR BETTER GROWTH Performed at [facility], [place] [address] [place], [place] [address]    Report Status PENDING  Incomplete  SARS Coronavirus 2 by RT PCR (hospital order, performed in [facility] hospital lab) Nasopharyngeal Nasopharyngeal Swab     Status: Abnormal   Collection Time: 07/06/20 11:45 AM   Specimen: Nasopharyngeal Swab  Result Value Ref Range Status   SARS Coronavirus 2 POSITIVE (A) NEGATIVE Final    Comment: RESULT CALLED TO, READ BACK BY AND VERIFIED WITH: THOMPSON,C. RN AT  1326 07/06/20 MULLINS,T (NOTE) SARS-CoV-2 target nucleic acids are DETECTED  SARS-CoV-2 RNA is generally detectable in upper respiratory specimens  during the acute  phase of infection.  Positive results are indicative  of the presence of the identified virus, but do not rule out bacterial infection or co-infection with other pathogens not detected by the test.  Clinical correlation with patient history and  other diagnostic information is necessary to determine patient infection status.  The expected result is negative.  Fact Sheet for Patients:   StrictlyIdeas.no   Fact Sheet for Healthcare Providers:   BankingDealers.co.za    This test is not yet approved or cleared by the [place] FDA and  has been authorized for detection and/or diagnosis of SARS-CoV-2 by FDA under an Emergency Use Authorization (EUA).  This EUA will remain in effect (meaning  this test can be used) for the duration of  the COVID-19 declaration under Section 564(b)(1) of the Act, 21 U.S.C. section 360-bbb-3(b)(1), unless the authorization is terminated or revoked sooner.  Performed at [facility], [place] [address], [place], [place] [address]      Time coordinating discharge: Over 30 minutes  SIGNED:   Genesis Paget J [place] DO  [facility] 07/07/2020, 11:25 AM

## 2020-07-07 NOTE — Discharge Instructions (Addendum)
You are scheduled for a Remdesivir infusion on 2/7 and 2/8 at 1030AM. Please come to [facility], you will see a COVID infusion banner by the road.  Enter there and turn left.  There are marked spaces for Infusion. Call the number on the sign or [phone] and someone will come out and bring you inside. If someone is driving you please come to the same area and call the number and someone will come outside to get you. Thank you!    10 Things You Can Do to Manage Your COVID-19 Symptoms at Home If you have possible or confirmed COVID-19: 1. Stay home except to get medical care. 2. Monitor your symptoms carefully. If your symptoms get worse, call your healthcare provider immediately. 3. Get rest and stay hydrated. 4. If you have a medical appointment, call the healthcare provider ahead of time and tell them that you have or may have COVID-19. 5. For medical emergencies, call 911 and notify the dispatch personnel that you have or may have COVID-19. 6. Cover your cough and sneezes with a tissue or use the inside of your elbow. 7. Wash your hands often with soap and water for at least 20 seconds or clean your hands with an alcohol-based hand sanitizer that contains at least 60% alcohol. 8. As much as possible, stay in a specific room and away from other people in your home. Also, you should use a separate bathroom, if available. If you need to be around other people in or outside of the home, wear a mask. 9. Avoid sharing personal items with other people in your household, like dishes, towels, and bedding. 10. Clean all surfaces that are touched often, like counters, tabletops, and doorknobs. Use household cleaning sprays or wipes according to the label instructions. [email or web address] 12/16/2019 This information is not intended to replace advice given to you by your health care provider. Make sure you discuss any questions you have with your health care provider. Document Revised: 04/02/2020  Document Reviewed: 04/02/2020 Elsevier Patient Education  2021 Elsevier Inc.    Person Under Monitoring Name: Julie Woodward  Location: [address] [place] [address]   Infection Prevention Recommendations for Individuals Confirmed to have, or Being Evaluated for, 2019 Novel Coronavirus (COVID-19) Infection Who Receive Care at Home  Individuals who are confirmed to have, or are being evaluated for, COVID-19 should follow the prevention steps below until a healthcare provider or local or state health department says they can return to normal activities.  Stay home except to get medical care You should restrict activities outside your home, except for getting medical care. Do not go to work, school, or public areas, and do not use public transportation or taxis.  Call ahead before visiting your doctor Before your medical appointment, call the healthcare provider and tell them that you have, or are being evaluated for, COVID-19 infection. This will help the healthcare provider's office take steps to keep other people from getting infected. Ask your healthcare provider to call the local or state health department.  Monitor your symptoms Seek prompt medical attention if your illness is worsening (e.g., difficulty breathing). Before going to your medical appointment, call the healthcare provider and tell them that you have, or are being evaluated for, COVID-19 infection. Ask your healthcare provider to call the local or state health department.  Wear a facemask You should wear a facemask that covers your nose and mouth when you are in the same room with other people  and when you visit a healthcare provider. People who live with or visit you should also wear a facemask while they are in the same room with you.  Separate yourself from other people in your home As much as possible, you should stay in a different room from other people in your home. Also, you should use  a separate bathroom, if available.  Avoid sharing household items You should not share dishes, drinking glasses, cups, eating utensils, towels, bedding, or other items with other people in your home. After using these items, you should wash them thoroughly with soap and water.  Cover your coughs and sneezes Cover your mouth and nose with a tissue when you cough or sneeze, or you can cough or sneeze into your sleeve. Throw used tissues in a lined trash can, and immediately wash your hands with soap and water for at least 20 seconds or use an alcohol-based hand rub.  Wash your [facility] your hands often and thoroughly with soap and water for at least 20 seconds. You can use an alcohol-based hand sanitizer if soap and water are not available and if your hands are not visibly dirty. Avoid touching your eyes, nose, and mouth with unwashed hands.   Prevention Steps for Caregivers and Household Members of Individuals Confirmed to have, or Being Evaluated for, COVID-19 Infection Being Cared for in the Home  If you live with, or provide care at home for, a person confirmed to have, or being evaluated for, COVID-19 infection please follow these guidelines to prevent infection:  Follow healthcare provider's instructions Make sure that you understand and can help the patient follow any healthcare provider instructions for all care.  Provide for the patient's basic needs You should help the patient with basic needs in the home and provide support for getting groceries, prescriptions, and other personal needs.  Monitor the patient's symptoms If they are getting sicker, call his or her medical provider and tell them that the patient has, or is being evaluated for, COVID-19 infection. This will help the healthcare provider's office take steps to keep other people from getting infected. Ask the healthcare provider to call the local or state health department.  Limit the number of people who have  contact with the patient  If possible, have only one caregiver for the patient.  Other household members should stay in another home or place of residence. If this is not possible, they should stay  in another room, or be separated from the patient as much as possible. Use a separate bathroom, if available.  Restrict visitors who do not have an essential need to be in the home.  Keep older adults, very young children, and other sick people away from the patient Keep older adults, very young children, and those who have compromised immune systems or chronic health conditions away from the patient. This includes people with chronic heart, lung, or kidney conditions, diabetes, and cancer.  Ensure good ventilation Make sure that shared spaces in the home have good air flow, such as from an air conditioner or an opened window, weather permitting.  Wash your hands often  Wash your hands often and thoroughly with soap and water for at least 20 seconds. You can use an alcohol based hand sanitizer if soap and water are not available and if your hands are not visibly dirty.  Avoid touching your eyes, nose, and mouth with unwashed hands.  Use disposable paper towels to dry your hands. If not available, use dedicated  cloth towels and replace them when they become wet.  Wear a facemask and gloves  Wear a disposable facemask at all times in the room and gloves when you touch or have contact with the patient's blood, body fluids, and/or secretions or excretions, such as sweat, saliva, sputum, nasal mucus, vomit, urine, or feces.  Ensure the mask fits over your nose and mouth tightly, and do not touch it during use.  Throw out disposable facemasks and gloves after using them. Do not reuse.  Wash your hands immediately after removing your facemask and gloves.  If your personal clothing becomes contaminated, carefully remove clothing and launder. Wash your hands after handling contaminated  clothing.  Place all used disposable facemasks, gloves, and other waste in a lined container before disposing them with other household waste.  Remove gloves and wash your hands immediately after handling these items.  Do not share dishes, glasses, or other household items with the patient  Avoid sharing household items. You should not share dishes, drinking glasses, cups, eating utensils, towels, bedding, or other items with a patient who is confirmed to have, or being evaluated for, COVID-19 infection.  After the person uses these items, you should wash them thoroughly with soap and water.  Wash laundry thoroughly  Immediately remove and wash clothes or bedding that have blood, body fluids, and/or secretions or excretions, such as sweat, saliva, sputum, nasal mucus, vomit, urine, or feces, on them.  Wear gloves when handling laundry from the patient.  Read and follow directions on labels of laundry or clothing items and detergent. In general, wash and dry with the warmest temperatures recommended on the label.  Clean all areas the individual has used often  Clean all touchable surfaces, such as counters, tabletops, doorknobs, bathroom fixtures, toilets, phones, keyboards, tablets, and bedside tables, every day. Also, clean any surfaces that may have blood, body fluids, and/or secretions or excretions on them.  Wear gloves when cleaning surfaces the patient has come in contact with.  Use a diluted bleach solution (e.g., dilute bleach with 1 part bleach and 10 parts water) or a household disinfectant with a label that says EPA-registered for coronaviruses. To make a bleach solution at home, add 1 tablespoon of bleach to 1 quart (4 cups) of water. For a larger supply, add  cup of bleach to 1 gallon (16 cups) of water.  Read labels of cleaning products and follow recommendations provided on product labels. Labels contain instructions for safe and effective use of the cleaning product  including precautions you should take when applying the product, such as wearing gloves or eye protection and making sure you have good ventilation during use of the product.  Remove gloves and wash hands immediately after cleaning.  Monitor yourself for signs and symptoms of illness Caregivers and household members are considered close contacts, should monitor their health, and will be asked to limit movement outside of the home to the extent possible. Follow the monitoring steps for close contacts listed on the symptom monitoring form.   ? If you have additional questions, contact your local health department or call the epidemiologist on call at [phone] (available 24/7). ? This guidance is subject to change. For the most up-to-date guidance from [facility], please refer to their website: [email or web address]

## 2020-07-08 LAB — CULTURE, RESPIRATORY W GRAM STAIN: Culture: NORMAL

## 2020-07-09 ENCOUNTER — Ambulatory Visit (HOSPITAL_COMMUNITY)
Admit: 2020-07-09 | Discharge: 2020-07-09 | Disposition: A | Discharge status: Home / Self Care | Payer: Medicare HMO | Source: Ambulatory Visit | Attending: Pulmonary Disease | Admitting: Pulmonary Disease

## 2020-07-09 DIAGNOSIS — U071 COVID-19: Secondary | ICD-10-CM | POA: Insufficient documentation

## 2020-07-09 DIAGNOSIS — J1282 Pneumonia due to coronavirus disease 2019: Secondary | ICD-10-CM | POA: Diagnosis not present

## 2020-07-09 MED ORDER — EPINEPHRINE 0.3 MG/0.3ML IJ SOAJ: 0.3000 mg | Freq: Once | INTRAMUSCULAR | Status: DC | PRN

## 2020-07-09 MED ORDER — ALBUTEROL SULFATE HFA 108 (90 BASE) MCG/ACT IN AERS: 2.0000 | INHALATION_SPRAY | Freq: Once | RESPIRATORY_TRACT | Status: DC | PRN

## 2020-07-09 MED ORDER — METHYLPREDNISOLONE SODIUM SUCC 125 MG IJ SOLR: 125.0000 mg | Freq: Once | INTRAMUSCULAR | Status: DC | PRN

## 2020-07-09 MED ORDER — SODIUM CHLORIDE 0.9 % IV SOLN
100.0000 mg | Freq: Once | INTRAVENOUS | Status: AC
  Administered 2020-07-09: 100 mg via INTRAVENOUS

## 2020-07-09 MED ORDER — SODIUM CHLORIDE 0.9 % IV SOLN: INTRAVENOUS | Status: DC | PRN

## 2020-07-09 MED ORDER — DIPHENHYDRAMINE HCL 50 MG/ML IJ SOLN: 50.0000 mg | Freq: Once | INTRAMUSCULAR | Status: DC | PRN

## 2020-07-09 MED ORDER — FAMOTIDINE IN NACL 20-0.9 MG/50ML-% IV SOLN: 20.0000 mg | Freq: Once | INTRAVENOUS | Status: DC | PRN

## 2020-07-09 NOTE — Progress Notes (Signed)
Patient reviewed Fact Sheet for Patients, Parents, and Caregivers for Emergency Use Authorization (EUA) of remdesivir for the Treatment of Coronavirus. Patient also reviewed and is agreeable to the estimated cost of treatment. Patient is agreeable to proceed.    

## 2020-07-09 NOTE — Discharge Instructions (Signed)
10 Things You Can Do to Manage Your COVID-19 Symptoms at Home °If you have possible or confirmed COVID-19: °1. Stay home except to get medical care. °2. Monitor your symptoms carefully. If your symptoms get worse, call your healthcare provider immediately. °3. Get rest and stay hydrated. °4. If you have a medical appointment, call the healthcare provider ahead of time and tell them that you have or may have COVID-19. °5. For medical emergencies, call 911 and notify the dispatch personnel that you have or may have COVID-19. °6. Cover your cough and sneezes with a tissue or use the inside of your elbow. °7. Wash your hands often with soap and water for at least 20 seconds or clean your hands with an alcohol-based hand sanitizer that contains at least 60% alcohol. °8. As much as possible, stay in a specific room and away from other people in your home. Also, you should use a separate bathroom, if available. If you need to be around other people in or outside of the home, wear a mask. °9. Avoid sharing personal items with other people in your household, like dishes, towels, and bedding. °10. Clean all surfaces that are touched often, like counters, tabletops, and doorknobs. Use household cleaning sprays or wipes according to the label instructions. °cdc.gov/coronavirus °12/16/2019 °This information is not intended to replace advice given to you by your health care provider. Make sure you discuss any questions you have with your health care provider. °Document Revised: 04/02/2020 Document Reviewed: 04/02/2020 °Elsevier Patient Education © 2021 Elsevier Inc. °If you have any questions or concerns after the infusion please call the Advanced Practice Provider on call at [phone]. This number is ONLY intended for your use regarding questions or concerns about the infusion post-treatment side-effects.  Please do not provide this number to others for use. For return to work notes please contact your primary care provider.   ° °If someone you know is interested in receiving treatment please have them contact their MD for a referral or visit [email or web address] ° ° ° °

## 2020-07-09 NOTE — Progress Notes (Signed)
  Diagnosis: COVID-19  Physician: Dr. Patrick Wright  Procedure: Covid Infusion Clinic Med: remdesivir infusion - Provided patient with remdesivir fact sheet for patients, parents and caregivers prior to infusion.  Complications: No immediate complications noted.  Discharge: Discharged home   Julie Woodward 07/09/2020  

## 2020-07-10 ENCOUNTER — Ambulatory Visit (HOSPITAL_COMMUNITY)
Admit: 2020-07-10 | Discharge: 2020-07-10 | Disposition: A | Discharge status: Home / Self Care | Payer: Medicare HMO | Attending: Pulmonary Disease | Admitting: Pulmonary Disease

## 2020-07-10 DIAGNOSIS — U071 COVID-19: Secondary | ICD-10-CM | POA: Diagnosis not present

## 2020-07-10 DIAGNOSIS — J1282 Pneumonia due to coronavirus disease 2019: Secondary | ICD-10-CM | POA: Diagnosis not present

## 2020-07-10 MED ORDER — EPINEPHRINE 0.3 MG/0.3ML IJ SOAJ: 0.3000 mg | Freq: Once | INTRAMUSCULAR | Status: DC | PRN

## 2020-07-10 MED ORDER — ALBUTEROL SULFATE HFA 108 (90 BASE) MCG/ACT IN AERS: 2.0000 | INHALATION_SPRAY | Freq: Once | RESPIRATORY_TRACT | Status: DC | PRN

## 2020-07-10 MED ORDER — SODIUM CHLORIDE 0.9 % IV SOLN
100.0000 mg | Freq: Once | INTRAVENOUS | Status: AC
  Administered 2020-07-10: 100 mg via INTRAVENOUS

## 2020-07-10 MED ORDER — METHYLPREDNISOLONE SODIUM SUCC 125 MG IJ SOLR: 125.0000 mg | Freq: Once | INTRAMUSCULAR | Status: DC | PRN

## 2020-07-10 MED ORDER — SODIUM CHLORIDE 0.9 % IV SOLN: INTRAVENOUS | Status: DC | PRN

## 2020-07-10 MED ORDER — DIPHENHYDRAMINE HCL 50 MG/ML IJ SOLN: 50.0000 mg | Freq: Once | INTRAMUSCULAR | Status: DC | PRN

## 2020-07-10 MED ORDER — FAMOTIDINE IN NACL 20-0.9 MG/50ML-% IV SOLN: 20.0000 mg | Freq: Once | INTRAVENOUS | Status: DC | PRN

## 2020-07-10 NOTE — Discharge Instructions (Signed)
If you have any questions or concerns after the infusion please call the Advanced Practice Provider on call at [phone]. This number is ONLY intended for your use regarding questions or concerns about the infusion post-treatment side-effects.  Please do not provide this number to others for use. For return to work notes please contact your primary care provider.   If someone you know is interested in receiving treatment please have them contact their MD for a referral or visit [email or web address]

## 2020-07-10 NOTE — Discharge Summary (Signed)
  Diagnosis: COVID-19  Physician:  Procedure: Covid Infusion Clinic Med: remdesivir infusion - Provided patient with remdesivir fact sheet for patients, parents and caregivers prior to infusion.  Complications: No immediate complications noted.  Discharge: Discharged home   Chyrel Masson 07/10/2020

## 2020-07-10 NOTE — Progress Notes (Signed)
Patient reviewed Fact Sheet for Patients, Parents, and Caregivers for Emergency Use Authorization (EUA) of sotrovimab for the Treatment of Coronavirus. Patient also reviewed and is agreeable to the estimated cost of treatment. Patient is agreeable to proceed.   

## 2020-07-12 DIAGNOSIS — I499 Cardiac arrhythmia, unspecified: Secondary | ICD-10-CM | POA: Diagnosis not present

## 2020-07-12 DIAGNOSIS — E039 Hypothyroidism, unspecified: Secondary | ICD-10-CM | POA: Diagnosis not present

## 2020-07-12 DIAGNOSIS — Z8616 Personal history of COVID-19: Secondary | ICD-10-CM | POA: Diagnosis not present

## 2020-07-12 DIAGNOSIS — Z9289 Personal history of other medical treatment: Secondary | ICD-10-CM | POA: Diagnosis not present

## 2020-07-13 ENCOUNTER — Encounter (HOSPITAL_COMMUNITY): Payer: Self-pay | Admitting: Nurse Practitioner

## 2020-07-13 ENCOUNTER — Ambulatory Visit (HOSPITAL_COMMUNITY)
Admission: RE | Admit: 2020-07-13 | Discharge: 2020-07-13 | Disposition: A | Discharge status: Home / Self Care | Payer: Medicare HMO | Source: Ambulatory Visit | Attending: Nurse Practitioner | Admitting: Nurse Practitioner

## 2020-07-13 ENCOUNTER — Other Ambulatory Visit: Payer: Self-pay

## 2020-07-13 VITALS — BP 116/60 | HR 76 | Ht 65.0 in | Wt 171.4 lb

## 2020-07-13 DIAGNOSIS — Z8616 Personal history of COVID-19: Secondary | ICD-10-CM | POA: Insufficient documentation

## 2020-07-13 DIAGNOSIS — Z87891 Personal history of nicotine dependence: Secondary | ICD-10-CM | POA: Diagnosis not present

## 2020-07-13 DIAGNOSIS — I4891 Unspecified atrial fibrillation: Secondary | ICD-10-CM | POA: Insufficient documentation

## 2020-07-13 NOTE — Progress Notes (Signed)
Primary Care Physician: Carol Ada, MD Referring Physician: Benjiman Core, PA PCP: Dr. Jeremy Johann    Julie Woodward is a 75 y.o. female with a h/o hypothyroidism, GERD, schwannoma, recently tested positive for Covid-19 on 06/21/2020 with home test who presented to the ED with progressive shortness of breath. Was unvaccinated against Covid-19. Patient was seen by her PCP and was prescribed Levaquin and steroids. Then she received a second round of antibiotics and steroids, but despite treatment continue to have progressive shortness of breath. Also associated fatigue, nonproductive cough, nausea and loss of taste and smell. She was discharged on prednisone taper,  inhalers, and received outpatient  remdesivir  Infusion 2/7 and 2/8.   She had f/u with her PCP yesterday and  was found to afib. She was started on Toprol  25 mg qd and she took a dose last pm and this  am. She is now in sinus rhythm. She feels better today, yesterday she felt as though  She  could not breathe as well. That has resolved today.   No alcohol, minimal caffeine, denies snoring. No tobacco.   Today, she denies symptoms of palpitations, chest pain, shortness of breath, orthopnea, PND, lower extremity edema, dizziness, presyncope, syncope, or neurologic sequela. The patient is tolerating medications without difficulties and is otherwise without complaint today.   Past Medical History:  Diagnosis Date  . Arm mass, right   . Bladder incontinence    no meds  . GERD (gastroesophageal reflux disease)   . Hypothyroidism    no meds  . Plantar fasciitis   . Schwannoma    Past Surgical History:  Procedure Laterality Date  . ABDOMINAL HYSTERECTOMY    . ABDOMINOPLASTY  1993  . ANKLE HARDWARE REMOVAL  2010  . APPENDECTOMY    . BLADDER SURGERY  1982  . BREAST REDUCTION SURGERY  1993  . COLON SURGERY    . DILATION AND CURETTAGE OF UTERUS    . MASS EXCISION Right 02/10/2018   Procedure: EXCISION OF RIGHT UPPER ARM  MASS;  Surgeon: [name], MD;  Location: [facility];  Service: General;  Laterality: Right;  . ORIF ANKLE FRACTURE    . ORIF RADIAL FRACTURE Right 09/03/2019   Procedure: OPEN REDUCTION INTERNAL FIXATION (ORIF) RADIAL AND ULNAR  FRACTURE;  Surgeon: Verner Mould, MD;  Location: [facility];  Service: Orthopedics;  Laterality: Right;  . RETROMASTOID CRANIOTOMY Left    for schwannoma  . SEPTOPLASTY Bilateral   . TUBAL LIGATION    . VENTRICULOPERITONEAL SHUNT      Current Outpatient Medications  Medication Sig Dispense Refill  . Acetylcysteine (NAC 600) 600 MG CAPS 1 capsule    . albuterol (VENTOLIN HFA) 108 (90 Base) MCG/ACT inhaler Inhale 2 puffs into the lungs every 6 (six) hours as needed for wheezing or shortness of breath.    . Ascorbic Acid (VITAMIN C) 1000 MG tablet Take 3,000 mg by mouth daily.    . Calcium Carb-Cholecalciferol 1000-800 MG-UNIT TABS Take 1 tablet by mouth daily.    . cholecalciferol (VITAMIN D3) 25 MCG (1000 UNIT) tablet Take 1,000 Units by mouth daily.    Marland Kitchen co-enzyme Q-10 30 MG capsule Take 30 mg by mouth daily.    . Collagen 500 MG CAPS Take 500 mg by mouth daily.    . fluticasone (FLONASE) 50 MCG/ACT nasal spray Place 2 sprays into both nostrils daily.    Marland Kitchen Lysine 1000 MG TABS Take 1,000 mg by mouth daily.    Marland Kitchen  Magnesium 400 MG CAPS Take 400 mg by mouth daily.    . metoprolol succinate (TOPROL-XL) 50 MG 24 hr tablet 1 tablet Orally Once a day 30 day(s)    . Misc Natural Products (GLUCOSAMINE CHONDROITIN ADV PO) Take 1 tablet by mouth daily.    . Multiple Vitamin (MULTIVITAMIN WITH MINERALS) TABS tablet Take 1 tablet by mouth daily.    . Omega-3 Fatty Acids (FISH OIL) 1000 MG CAPS Take 1,000 mg by mouth daily.    . predniSONE (DELTASONE) 5 MG tablet Take 8 tablets (40 mg total) by mouth daily with breakfast for 3 days, THEN 6 tablets (30 mg total) daily with breakfast for 3 days, THEN 4 tablets (20 mg total) daily with breakfast for 3 days,  THEN 2 tablets (10 mg total) daily with breakfast for 3 days, THEN 1 tablet (5 mg total) daily with breakfast for 3 days. 63 tablet 0  . vitamin E 400 UNIT capsule Take 400 Units by mouth daily.    Marland Kitchen zinc gluconate 50 MG tablet Take 50 mg by mouth daily.     No current facility-administered medications for this encounter.    Allergies  Allergen Reactions  . Morphine And Related Nausea And Vomiting    Social History   Socioeconomic History  . Marital status: Married    Spouse name: Not on file  . Number of children: Not on file  . Years of education: Not on file  . Highest education level: Not on file  Occupational History  . Not on file  Tobacco Use  . Smoking status: Former Research scientist (life sciences)  . Smokeless tobacco: Never Used  . Tobacco comment: 40 years ago   Substance and Sexual Activity  . Alcohol use: Not Currently  . Drug use: Never  . Sexual activity: Not on file  Other Topics Concern  . Not on file  Social History Narrative  . Not on file   Social Determinants of Health   Financial Resource Strain: Not on file  Food Insecurity: Not on file  Transportation Needs: Not on file  Physical Activity: Not on file  Stress: Not on file  Social Connections: Not on file  Intimate Partner Violence: Not on file    Family History  Problem Relation Age of Onset  . Hypertension Other     ROS- All systems are reviewed and negative except as per the HPI above  Physical Exam: Vitals:   07/13/20 1328  BP: 116/60  Pulse: 76  Weight: 77.7 kg  Height: 5\' 5"  (1.651 m)   Wt Readings from Last 3 Encounters:  07/13/20 77.7 kg  07/05/20 75.8 kg  09/03/19 81.6 kg    Labs: Lab Results  Component Value Date   NA 137 07/07/2020   K 4.2 07/07/2020   CL 107 07/07/2020   CO2 22 07/07/2020   GLUCOSE 152 (H) 07/07/2020   BUN 18 07/07/2020   CREATININE 0.56 07/07/2020   CALCIUM 8.6 (L) 07/07/2020   PHOS 3.3 07/06/2020   MG 2.0 07/07/2020   Lab Results  Component Value Date    INR 1.0 09/03/2019   No results found for: CHOL, HDL, LDLCALC, TRIG   GEN- The patient is well appearing, alert and oriented x 3 today.   Head- normocephalic, atraumatic Eyes-  Sclera clear, conjunctiva pink Ears- hearing intact Oropharynx- clear Neck- supple, no JVP Lymph- no cervical lymphadenopathy Lungs- Clear to ausculation bilaterally, normal work of breathing Heart- Regular rate and rhythm, no murmurs, rubs or gallops, PMI not  laterally displaced GI- soft, NT, ND, + BS Extremities- no clubbing, cyanosis, or edema MS- no significant deformity or atrophy Skin- no rash or lesion Psych- euthymic mood, full affect Neuro- strength and sensation are intact  EKG-NSR, normal ekg at 76 bpm.   Assessment and Plan: 1. New onset afib In the setting of covid pneumonia, prednisone taper, inhalers 2 doses of BB and she is back in SR I will order an echo  Hopefully  as she gets over covid pneumonia, and off prednisone, the afib will be quiet She will stay on Toprol 25 mg daily for now  Echo ordered  2. CHA2DS2VASc score of 2 For now does not meet guidelines to be on daily anticoagulation                        I will see back in 2 weeks I plan to place a 2 week zio patch at that point If no further afib on monitor and her issues have resolved, then will consider stopping BB as she does not want to stay on daily meds if can be avoided   Butch Penny C. Verlean Allport, [facility] [address] [place], [place] [address] [phone]

## 2020-07-30 ENCOUNTER — Encounter (HOSPITAL_COMMUNITY): Payer: Self-pay | Admitting: Nurse Practitioner

## 2020-07-30 ENCOUNTER — Ambulatory Visit (HOSPITAL_COMMUNITY)
Admission: RE | Admit: 2020-07-30 | Discharge: 2020-07-30 | Disposition: A | Discharge status: Home / Self Care | Payer: Medicare HMO | Source: Ambulatory Visit | Attending: Nurse Practitioner | Admitting: Nurse Practitioner

## 2020-07-30 ENCOUNTER — Other Ambulatory Visit: Payer: Self-pay

## 2020-07-30 ENCOUNTER — Ambulatory Visit (HOSPITAL_BASED_OUTPATIENT_CLINIC_OR_DEPARTMENT_OTHER)
Admission: RE | Admit: 2020-07-30 | Discharge: 2020-07-30 | Disposition: A | Discharge status: Home / Self Care | Payer: Medicare HMO | Source: Ambulatory Visit | Attending: Nurse Practitioner | Admitting: Nurse Practitioner

## 2020-07-30 VITALS — BP 128/80 | HR 74 | Ht 65.0 in | Wt 174.6 lb

## 2020-07-30 DIAGNOSIS — E039 Hypothyroidism, unspecified: Secondary | ICD-10-CM | POA: Insufficient documentation

## 2020-07-30 DIAGNOSIS — D3610 Benign neoplasm of peripheral nerves and autonomic nervous system, unspecified: Secondary | ICD-10-CM | POA: Insufficient documentation

## 2020-07-30 DIAGNOSIS — K219 Gastro-esophageal reflux disease without esophagitis: Secondary | ICD-10-CM | POA: Diagnosis not present

## 2020-07-30 DIAGNOSIS — I480 Paroxysmal atrial fibrillation: Secondary | ICD-10-CM

## 2020-07-30 DIAGNOSIS — Z87891 Personal history of nicotine dependence: Secondary | ICD-10-CM | POA: Insufficient documentation

## 2020-07-30 DIAGNOSIS — I4891 Unspecified atrial fibrillation: Secondary | ICD-10-CM

## 2020-07-30 DIAGNOSIS — Z79899 Other long term (current) drug therapy: Secondary | ICD-10-CM | POA: Insufficient documentation

## 2020-07-30 DIAGNOSIS — Z8616 Personal history of COVID-19: Secondary | ICD-10-CM | POA: Insufficient documentation

## 2020-07-30 LAB — ECHOCARDIOGRAM COMPLETE
Area-P 1/2: 3.91 cm2
Calc EF: 56.3 %
S' Lateral: 3 cm
Single Plane A2C EF: 58.8 %
Single Plane A4C EF: 51.6 %

## 2020-07-30 NOTE — Progress Notes (Signed)
  Echocardiogram 2D Echocardiogram has been performed.  Geoffery Lyons Swaim 07/30/2020, 2:18 PM

## 2020-07-30 NOTE — Progress Notes (Signed)
Primary Care Physician: Carol Ada, MD Referring Physician: Benjiman Core, PA PCP: Dr. Jeremy Johann    Julie Woodward is a 75 y.o. female with a h/o hypothyroidism, GERD, schwannoma, recently tested positive for Covid-19 on 06/21/2020 with home test who presented to the ED with progressive shortness of breath. Was unvaccinated against Covid-19. Patient was seen by her PCP and was prescribed Levaquin and steroids. Then she received a second round of antibiotics and steroids, but despite treatment continue to have progressive shortness of breath. Also associated fatigue, nonproductive cough, nausea and loss of taste and smell. She was discharged on prednisone taper,  inhalers, and received outpatient  remdesivir  infusion 2/7 and 2/8.   She had f/u with her PCP yesterday and  was found to afib. She was started on Toprol  25 mg qd and she took a dose last pm and this  am. She is now in sinus rhythm. She feels better today, yesterday she felt as though shecould not breathe as well. That has resolved today.   No alcohol, minimal caffeine, denies snoring. No tobacco.   F/u in afib clinic, 07/30/20. She remains in SR. She has felt some heart racing but not sustained. She still has some exertional short of breath but she contributes to covid pneumonia. She just had echo before this appointment but still pending results. She is noteing some sleepiness after am BB, will move to hs. Aio  patch for one week to see if any  afib burden.   Today, she denies symptoms of palpitations, chest pain, shortness of breath, orthopnea, PND, lower extremity edema, dizziness, presyncope, syncope, or neurologic sequela. The patient is tolerating medications without difficulties and is otherwise without complaint today.   Past Medical History:  Diagnosis Date  . Arm mass, right   . Bladder incontinence    no meds  . GERD (gastroesophageal reflux disease)   . Hypothyroidism    no meds  . Plantar fasciitis   .  Schwannoma    Past Surgical History:  Procedure Laterality Date  . ABDOMINAL HYSTERECTOMY    . ABDOMINOPLASTY  1993  . ANKLE HARDWARE REMOVAL  2010  . APPENDECTOMY    . BLADDER SURGERY  1982  . BREAST REDUCTION SURGERY  1993  . COLON SURGERY    . DILATION AND CURETTAGE OF UTERUS    . MASS EXCISION Right 02/10/2018   Procedure: EXCISION OF RIGHT UPPER ARM MASS;  Surgeon: [name], MD;  Location: [facility];  Service: General;  Laterality: Right;  . ORIF ANKLE FRACTURE    . ORIF RADIAL FRACTURE Right 09/03/2019   Procedure: OPEN REDUCTION INTERNAL FIXATION (ORIF) RADIAL AND ULNAR  FRACTURE;  Surgeon: Verner Mould, MD;  Location: [facility];  Service: Orthopedics;  Laterality: Right;  . RETROMASTOID CRANIOTOMY Left    for schwannoma  . SEPTOPLASTY Bilateral   . TUBAL LIGATION    . VENTRICULOPERITONEAL SHUNT      Current Outpatient Medications  Medication Sig Dispense Refill  . Acetylcysteine (NAC 600) 600 MG CAPS 1 capsule    . albuterol (VENTOLIN HFA) 108 (90 Base) MCG/ACT inhaler Inhale 2 puffs into the lungs every 6 (six) hours as needed for wheezing or shortness of breath.    . Ascorbic Acid (VITAMIN C) 1000 MG tablet Take 3,000 mg by mouth daily.    . Calcium Carb-Cholecalciferol 1000-800 MG-UNIT TABS Take 1 tablet by mouth daily.    . cholecalciferol (VITAMIN D3) 25 MCG (1000 UNIT)  tablet Take 1,000 Units by mouth daily.    Marland Kitchen co-enzyme Q-10 30 MG capsule Take 30 mg by mouth daily.    . Collagen 500 MG CAPS Take 500 mg by mouth daily.    . fluticasone (FLONASE) 50 MCG/ACT nasal spray Place 2 sprays into both nostrils daily.    Marland Kitchen Lysine 1000 MG TABS Take 1,000 mg by mouth daily.    . Magnesium 400 MG CAPS Take 400 mg by mouth daily.    . metoprolol succinate (TOPROL-XL) 50 MG 24 hr tablet 1 tablet Orally Once a day 30 day(s)    . Misc Natural Products (GLUCOSAMINE CHONDROITIN ADV PO) Take 1 tablet by mouth daily.    . Multiple Vitamin (MULTIVITAMIN WITH  MINERALS) TABS tablet Take 1 tablet by mouth daily.    . Omega-3 Fatty Acids (FISH OIL) 1000 MG CAPS Take 1,000 mg by mouth daily.    . vitamin E 400 UNIT capsule Take 400 Units by mouth daily.    Marland Kitchen zinc gluconate 50 MG tablet Take 50 mg by mouth daily.     No current facility-administered medications for this encounter.    Allergies  Allergen Reactions  . Morphine And Related Nausea And Vomiting    Social History   Socioeconomic History  . Marital status: Married    Spouse name: Not on file  . Number of children: Not on file  . Years of education: Not on file  . Highest education level: Not on file  Occupational History  . Not on file  Tobacco Use  . Smoking status: Former Research scientist (life sciences)  . Smokeless tobacco: Never Used  . Tobacco comment: 40 years ago   Substance and Sexual Activity  . Alcohol use: Not Currently  . Drug use: Never  . Sexual activity: Not on file  Other Topics Concern  . Not on file  Social History Narrative  . Not on file   Social Determinants of Health   Financial Resource Strain: Not on file  Food Insecurity: Not on file  Transportation Needs: Not on file  Physical Activity: Not on file  Stress: Not on file  Social Connections: Not on file  Intimate Partner Violence: Not on file    Family History  Problem Relation Age of Onset  . Hypertension Other     ROS- All systems are reviewed and negative except as per the HPI above  Physical Exam: There were no vitals filed for this visit. Wt Readings from Last 3 Encounters:  07/13/20 77.7 kg  07/05/20 75.8 kg  09/03/19 81.6 kg    Labs: Lab Results  Component Value Date   NA 137 07/07/2020   K 4.2 07/07/2020   CL 107 07/07/2020   CO2 22 07/07/2020   GLUCOSE 152 (H) 07/07/2020   BUN 18 07/07/2020   CREATININE 0.56 07/07/2020   CALCIUM 8.6 (L) 07/07/2020   PHOS 3.3 07/06/2020   MG 2.0 07/07/2020   Lab Results  Component Value Date   INR 1.0 09/03/2019   No results found for: CHOL, HDL,  LDLCALC, TRIG   GEN- The patient is well appearing, alert and oriented x 3 today.   Head- normocephalic, atraumatic Eyes-  Sclera clear, conjunctiva pink Ears- hearing intact Oropharynx- clear Neck- supple, no JVP Lymph- no cervical lymphadenopathy Lungs- Clear to ausculation bilaterally, normal work of breathing Heart- Regular rate and rhythm, no murmurs, rubs or gallops, PMI not laterally displaced GI- soft, NT, ND, + BS Extremities- no clubbing, cyanosis, or edema MS- no  significant deformity or atrophy Skin- no rash or lesion Psych- euthymic mood, full affect Neuro- strength and sensation are intact  EKG-NSR, normal ekg at 74 bpm, pr int 160 ms, qrs int 76 ms, qtc 415 ms   Assessment and Plan: 1. New onset afib In the setting of covid pneumonia, prednisone taper, inhalers She  is back in SR, some infrequent heart racing  Echo results pending   one week Zio patch, if no afib she would like to stop daily BB She will stay on Toprol 25 mg daily for now, but move to HS   2. CHA2DS2VASc score of 2 For now does not meet guidelines to be on daily anticoagulation                        I will notify pt of monitor results   Butch Penny C. Shelden Raborn, [facility] [address] [place], [place] [address] [phone]

## 2020-08-02 ENCOUNTER — Encounter (HOSPITAL_COMMUNITY): Payer: Self-pay | Admitting: *Deleted

## 2020-08-12 IMAGING — CT CT WRIST*R* W/O CM
1 series · 12 of 14 positions shown, 15 images · non-contrast
Comparison: Intraoperative fluoroscopic images dated 09/03/2019

CLINICAL DATA: Distal radial and ulnar fractures status post ORIF.
Evaluate healing.

EXAM:
CT OF THE RIGHT WRIST WITHOUT CONTRAST
TECHNIQUE: Multidetector CT imaging of the right wrist was performed according
to the standard protocol. Multiplanar CT image reconstructions were
also generated.

[Series 3: (person_name) · axial · 0.28mm/px · z∈[+28,+178]mm · 12 of 89 slices shown, 15 images]
[im 7/89  soft-tissue]
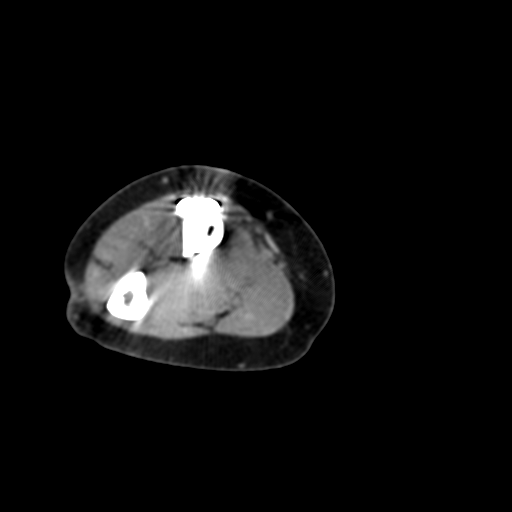
[im 7/89  bone]
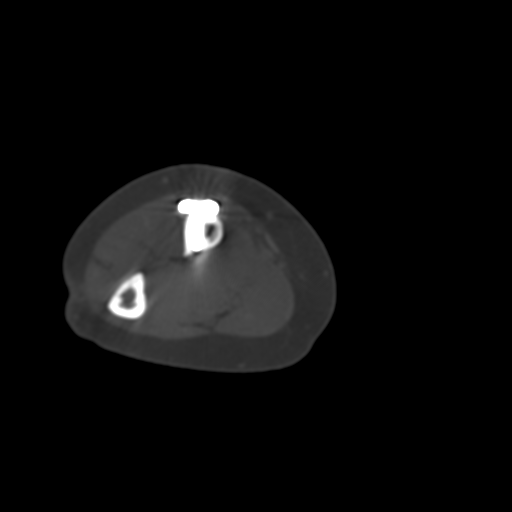
[im 14/89  bone]
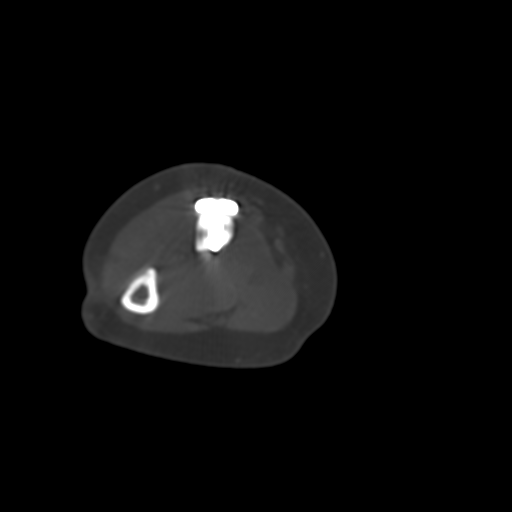
[im 21/89  bone]
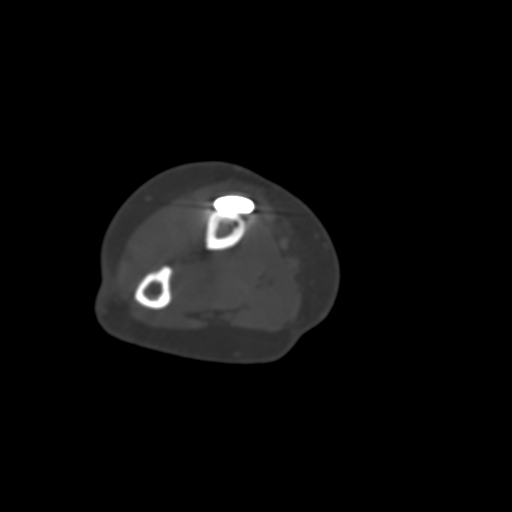
[im 28/89  bone]
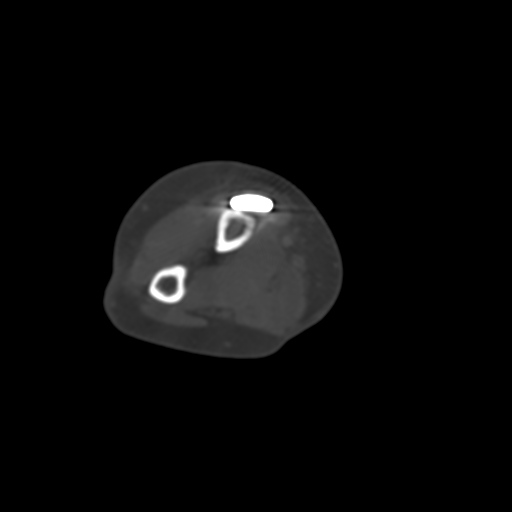
[im 34/89  soft-tissue]
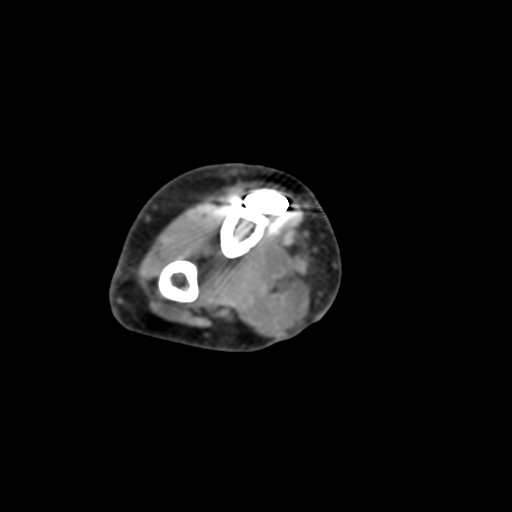
[im 34/89  bone]
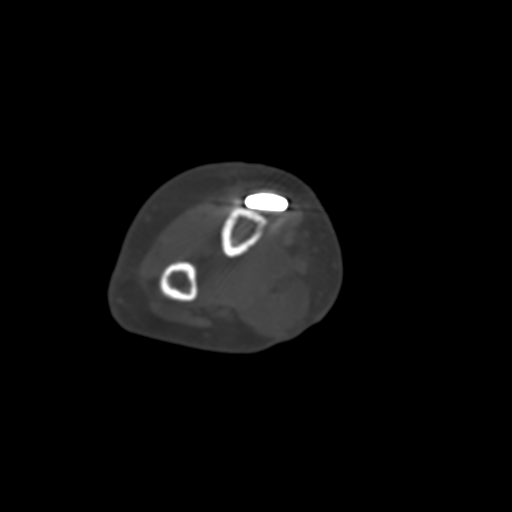
[im 41/89  bone]
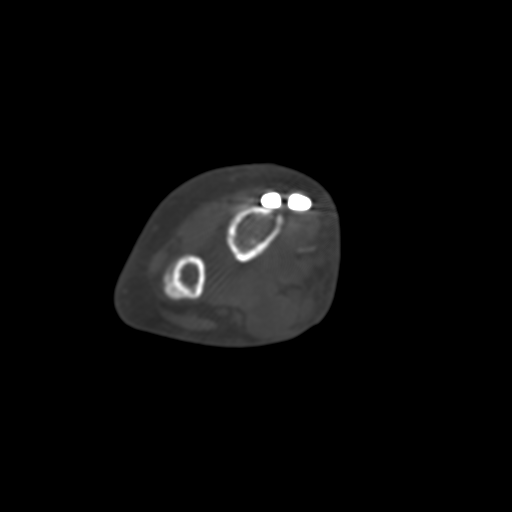
[im 48/89  bone]
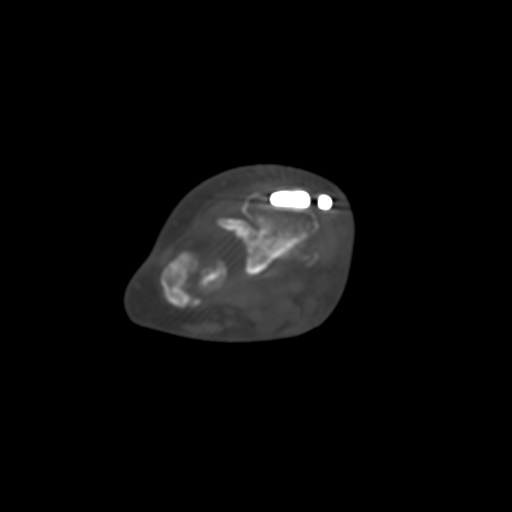
[im 55/89  bone]
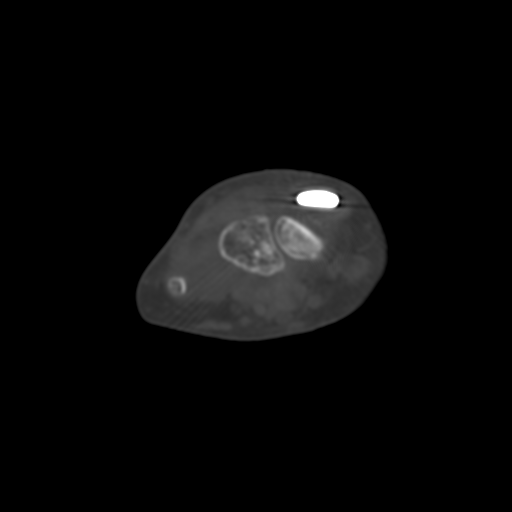
[im 61/89  soft-tissue]
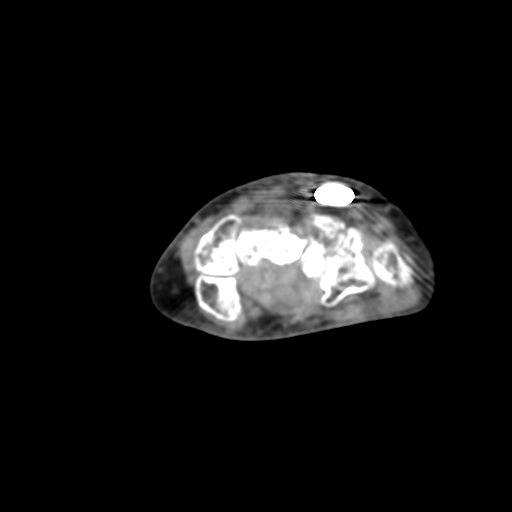
[im 61/89  bone]
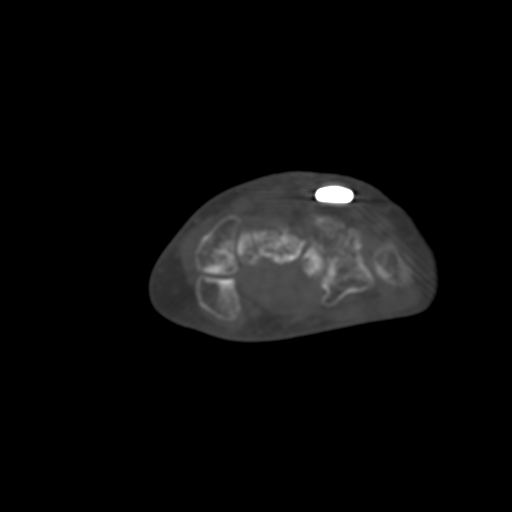
[im 68/89  bone]
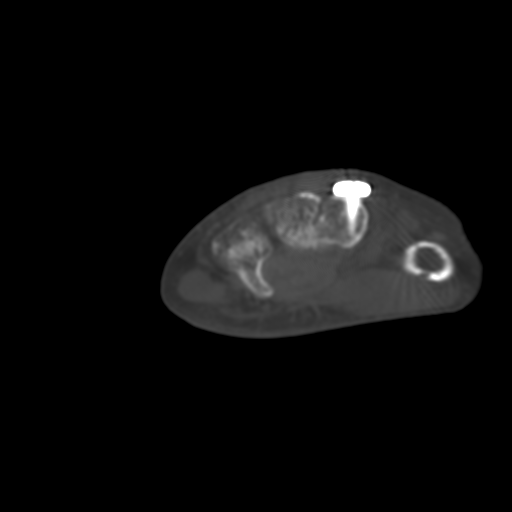
[im 75/89  bone]
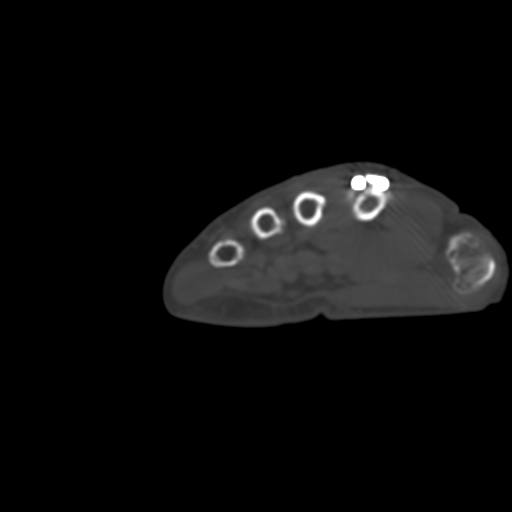
[im 82/89  bone]
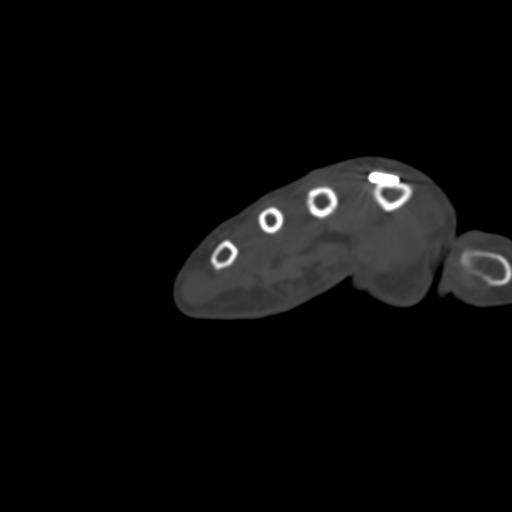

[12 of 14 positions shown; findings below may reference images not displayed]

FINDINGS: Bones/Joint/Cartilage

Dorsal side plate and screw fixation construct spanning from the
distal radial diaphysis to the second metacarpal diaphysis bridging
the distal radial fracture. Hardware appears intact. No evidence of
subsidence or perihardware fracture.

Comminuted distal radius fracture with no appreciable change in
alignment compared to intraoperative imaging. There is bridging bone
formation along the more dorsal aspect of the fracture line at the
base of the radial styloid (series 7, image 22; series 9, image 26).
Impacted fracture at the lunate fossa with 2 mm of articular-surface
depression. There is no bridging bone or callus formation at this
location (series 7, image 20; series 9, image 23).

Comminuted distal ulnar metaphyseal fracture with bridging callus
formation along the periphery of the fracture site (series 7, image
23). The transversely oriented fracture at the metaphysis remains
without bridging bone (series 7, image 23).

Distal radioulnar joint is widened. Radiocarpal alignment is
maintained. Carpal bones and carpal intraosseous spaces are intact.
Alignment of the carpometacarpal joints is maintained. Osseous
structures appear demineralized, likely secondary to disuse.

Ligaments

Suboptimally assessed by CT.

Muscles and Tendons

No appreciable myotendinous abnormality. No significant tenosynovial
fluid collection.

Soft tissues

No soft tissue fluid collection or hematoma.  No soft tissue gas.
IMPRESSION: 1. Partial interval healing of distal radial and ulnar fractures, as
detailed above.
2. No appreciable change in alignment compared to intraoperative
imaging. Distal radioulnar joint remains widened.

## 2020-08-13 DIAGNOSIS — I480 Paroxysmal atrial fibrillation: Secondary | ICD-10-CM | POA: Diagnosis not present

## 2020-08-30 ENCOUNTER — Telehealth (HOSPITAL_COMMUNITY): Payer: Self-pay | Admitting: *Deleted

## 2020-08-30 ENCOUNTER — Other Ambulatory Visit (HOSPITAL_COMMUNITY): Payer: Self-pay | Admitting: *Deleted

## 2020-08-30 MED ORDER — METOPROLOL SUCCINATE ER 25 MG PO TB24: 25.0000 mg | ORAL_TABLET | Freq: Every day | ORAL | 6 refills | Status: AC

## 2020-08-30 NOTE — Telephone Encounter (Signed)
Patient notified. Recall placed to see back before her birthday. Pt in agreement.

## 2020-08-30 NOTE — Telephone Encounter (Signed)
-----   Message from Sherran Needs, NP sent at 08/22/2020 12:58 PM EDT ----- Please let pt know that she still does have some afib going on  the monitor and it would benefit her to stay on the BB. Also, she will have a CHA2DS2VASc  score of 3 at her birthday in August(age 75) and at that point if still having some afib, by guidelines , she will need to be on a blood thinner then.  ----- Message ----- From: Juluis Mire, RN Sent: 08/14/2020   9:11 AM EDT To: Sherran Needs, NP

## 2021-02-12 DIAGNOSIS — Z0000 Encounter for general adult medical examination without abnormal findings: Secondary | ICD-10-CM | POA: Diagnosis not present

## 2021-02-12 DIAGNOSIS — E7800 Pure hypercholesterolemia, unspecified: Secondary | ICD-10-CM | POA: Diagnosis not present

## 2021-02-12 DIAGNOSIS — Z1159 Encounter for screening for other viral diseases: Secondary | ICD-10-CM | POA: Diagnosis not present

## 2021-02-12 DIAGNOSIS — Z1389 Encounter for screening for other disorder: Secondary | ICD-10-CM | POA: Diagnosis not present

## 2021-02-27 DIAGNOSIS — H5203 Hypermetropia, bilateral: Secondary | ICD-10-CM | POA: Diagnosis not present

## 2021-02-27 DIAGNOSIS — H2513 Age-related nuclear cataract, bilateral: Secondary | ICD-10-CM | POA: Diagnosis not present

## 2021-03-01 DIAGNOSIS — M79672 Pain in left foot: Secondary | ICD-10-CM | POA: Diagnosis not present

## 2021-03-11 DIAGNOSIS — H2513 Age-related nuclear cataract, bilateral: Secondary | ICD-10-CM | POA: Diagnosis not present

## 2021-03-11 DIAGNOSIS — H25013 Cortical age-related cataract, bilateral: Secondary | ICD-10-CM | POA: Diagnosis not present

## 2021-03-11 DIAGNOSIS — H25043 Posterior subcapsular polar age-related cataract, bilateral: Secondary | ICD-10-CM | POA: Diagnosis not present

## 2021-03-11 DIAGNOSIS — H2511 Age-related nuclear cataract, right eye: Secondary | ICD-10-CM | POA: Diagnosis not present

## 2021-03-15 DIAGNOSIS — M79672 Pain in left foot: Secondary | ICD-10-CM | POA: Diagnosis not present

## 2021-03-15 DIAGNOSIS — S92334A Nondisplaced fracture of third metatarsal bone, right foot, initial encounter for closed fracture: Secondary | ICD-10-CM | POA: Diagnosis not present

## 2021-03-15 DIAGNOSIS — S92344A Nondisplaced fracture of fourth metatarsal bone, right foot, initial encounter for closed fracture: Secondary | ICD-10-CM | POA: Diagnosis not present

## 2021-03-15 DIAGNOSIS — S92354A Nondisplaced fracture of fifth metatarsal bone, right foot, initial encounter for closed fracture: Secondary | ICD-10-CM | POA: Diagnosis not present

## 2021-03-29 DIAGNOSIS — E039 Hypothyroidism, unspecified: Secondary | ICD-10-CM | POA: Diagnosis not present

## 2021-04-15 DIAGNOSIS — S92334A Nondisplaced fracture of third metatarsal bone, right foot, initial encounter for closed fracture: Secondary | ICD-10-CM | POA: Diagnosis not present

## 2021-04-15 DIAGNOSIS — S92344A Nondisplaced fracture of fourth metatarsal bone, right foot, initial encounter for closed fracture: Secondary | ICD-10-CM | POA: Diagnosis not present

## 2021-04-15 DIAGNOSIS — S92354A Nondisplaced fracture of fifth metatarsal bone, right foot, initial encounter for closed fracture: Secondary | ICD-10-CM | POA: Diagnosis not present

## 2021-04-18 DIAGNOSIS — H2511 Age-related nuclear cataract, right eye: Secondary | ICD-10-CM | POA: Diagnosis not present

## 2021-04-19 DIAGNOSIS — H2513 Age-related nuclear cataract, bilateral: Secondary | ICD-10-CM | POA: Diagnosis not present

## 2021-04-19 DIAGNOSIS — H2512 Age-related nuclear cataract, left eye: Secondary | ICD-10-CM | POA: Diagnosis not present

## 2021-05-02 DIAGNOSIS — H2513 Age-related nuclear cataract, bilateral: Secondary | ICD-10-CM | POA: Diagnosis not present

## 2021-05-02 DIAGNOSIS — H2512 Age-related nuclear cataract, left eye: Secondary | ICD-10-CM | POA: Diagnosis not present

## 2021-05-10 DIAGNOSIS — Z1231 Encounter for screening mammogram for malignant neoplasm of breast: Secondary | ICD-10-CM | POA: Diagnosis not present

## 2021-05-15 DIAGNOSIS — S92351D Displaced fracture of fifth metatarsal bone, right foot, subsequent encounter for fracture with routine healing: Secondary | ICD-10-CM | POA: Diagnosis not present

## 2021-05-15 DIAGNOSIS — S92334D Nondisplaced fracture of third metatarsal bone, right foot, subsequent encounter for fracture with routine healing: Secondary | ICD-10-CM | POA: Diagnosis not present

## 2021-05-15 DIAGNOSIS — S92334A Nondisplaced fracture of third metatarsal bone, right foot, initial encounter for closed fracture: Secondary | ICD-10-CM | POA: Diagnosis not present

## 2021-05-15 DIAGNOSIS — S92341D Displaced fracture of fourth metatarsal bone, right foot, subsequent encounter for fracture with routine healing: Secondary | ICD-10-CM | POA: Diagnosis not present

## 2021-05-16 DIAGNOSIS — H00025 Hordeolum internum left lower eyelid: Secondary | ICD-10-CM | POA: Diagnosis not present

## 2021-05-23 DIAGNOSIS — H2513 Age-related nuclear cataract, bilateral: Secondary | ICD-10-CM | POA: Diagnosis not present

## 2021-06-05 DIAGNOSIS — Z780 Asymptomatic menopausal state: Secondary | ICD-10-CM | POA: Diagnosis not present

## 2021-06-05 DIAGNOSIS — M8589 Other specified disorders of bone density and structure, multiple sites: Secondary | ICD-10-CM | POA: Diagnosis not present

## 2021-08-24 ENCOUNTER — Encounter (HOSPITAL_COMMUNITY): Payer: Self-pay | Admitting: Emergency Medicine

## 2021-08-24 ENCOUNTER — Emergency Department (HOSPITAL_COMMUNITY)
Admission: EM | Admit: 2021-08-24 | Discharge: 2021-08-24 | Disposition: A | Discharge status: Home / Self Care | Payer: Medicare HMO | Attending: Emergency Medicine | Admitting: Emergency Medicine

## 2021-08-24 ENCOUNTER — Emergency Department (HOSPITAL_COMMUNITY): Payer: Medicare HMO

## 2021-08-24 ENCOUNTER — Other Ambulatory Visit: Payer: Self-pay

## 2021-08-24 DIAGNOSIS — M25512 Pain in left shoulder: Secondary | ICD-10-CM | POA: Insufficient documentation

## 2021-08-24 DIAGNOSIS — S42215A Unspecified nondisplaced fracture of surgical neck of left humerus, initial encounter for closed fracture: Secondary | ICD-10-CM | POA: Insufficient documentation

## 2021-08-24 DIAGNOSIS — S42212A Unspecified displaced fracture of surgical neck of left humerus, initial encounter for closed fracture: Secondary | ICD-10-CM | POA: Diagnosis not present

## 2021-08-24 DIAGNOSIS — S59912A Unspecified injury of left forearm, initial encounter: Secondary | ICD-10-CM | POA: Diagnosis not present

## 2021-08-24 DIAGNOSIS — S4992XA Unspecified injury of left shoulder and upper arm, initial encounter: Secondary | ICD-10-CM | POA: Diagnosis present

## 2021-08-24 DIAGNOSIS — W19XXXA Unspecified fall, initial encounter: Secondary | ICD-10-CM | POA: Insufficient documentation

## 2021-08-24 DIAGNOSIS — Z79899 Other long term (current) drug therapy: Secondary | ICD-10-CM | POA: Diagnosis not present

## 2021-08-24 MED ORDER — OXYCODONE HCL 5 MG PO TABS
5.0000 mg | ORAL_TABLET | Freq: Once | ORAL | Status: AC
  Administered 2021-08-24: 5 mg via ORAL
  Filled 2021-08-24: qty 1

## 2021-08-24 MED ORDER — HYDROCODONE-ACETAMINOPHEN 5-325 MG PO TABS
1.0000 | ORAL_TABLET | Freq: Once | ORAL | Status: AC
  Administered 2021-08-24: 1 via ORAL
  Filled 2021-08-24: qty 1

## 2021-08-24 MED ORDER — OXYCODONE HCL 5 MG PO TABS: 5.0000 mg | ORAL_TABLET | ORAL | 0 refills | Status: AC | PRN

## 2021-08-24 NOTE — Discharge Instructions (Signed)
Follow-up with your orthopedic.  Have also provided you with another name of orthopedic who is at [facility] who is on-call today.  But suspect Dr. Tedra Coupe would evaluate for this as well. ? ?Please do not do any lifting with your left upper extremity.  Keep your arm in sling immobilizer at all times.  It is okay to remove this for shower but otherwise you should wear your sling. ? ?Recommend 1000 mg of Tylenol every 6 hours as needed for pain.  Recommend 400 mg ibuprofen every 8 hours as needed for pain.  I have also prescribed you oxycodone which is a narcotic pain medicine.  Please take as prescribed.  Do not mix with alcohol or any other drugs as this medication is sedating.  Do not drive or do any dangerous activities while taking this medication. ?

## 2021-08-24 NOTE — ED Triage Notes (Addendum)
Pt states she put her foot on a rotten board and fell through her deck, now has L shoulder and forearm pain. Shoulder appears to have deformity, tenderness to forearm palpation.  ?

## 2021-08-24 NOTE — ED Provider Notes (Signed)
?[facility] DEPT ?Provider Note ? ? ?CSN: [id number] ?Arrival date & time: 08/24/21  1453 ? ?  ? ?History ? ?Chief Complaint  ?Patient presents with  ? Fall  ? Shoulder Pain  ? ? ?Julie Woodward is a 76 y.o. female. ? ?Patient here after she injured her left shoulder.  She was cleaning the deck when she went through a rotted board in the deck.  She reached out with her left arm outstretched and had significant pain to the left shoulder afterwards.  Did not hit her head or lose consciousness.  No neck pain or headache.  No other extremity pain.  Does not use blood thinners.  Movement makes the pain worse.  Nothing makes the pain better. ? ? ?Fall ? ?Shoulder Pain ? ?  ? ?Home Medications ?Prior to Admission medications   ?Medication Sig Start Date End Date Taking? Authorizing Provider  ?oxyCODONE (ROXICODONE) 5 MG immediate release tablet Take 1 tablet (5 mg total) by mouth every 4 (four) hours as needed for up to 20 doses for severe pain or breakthrough pain. 08/24/21  Yes Lennice Sites, DO  ?Acetylcysteine (NAC 600) 600 MG CAPS 1 capsule    [provider]  ?albuterol (VENTOLIN HFA) 108 (90 Base) MCG/ACT inhaler Inhale 2 puffs into the lungs every 6 (six) hours as needed for wheezing or shortness of breath. 06/26/20   [provider]  ?Ascorbic Acid (VITAMIN C) 1000 MG tablet Take 3,000 mg by mouth daily.    [provider]  ?Calcium Carb-Cholecalciferol 1000-800 MG-UNIT TABS Take 1 tablet by mouth daily.    [provider]  ?cholecalciferol (VITAMIN D3) 25 MCG (1000 UNIT) tablet Take 1,000 Units by mouth daily.    [provider]  ?co-enzyme Q-10 30 MG capsule Take 30 mg by mouth daily.    [provider]  ?Collagen 500 MG CAPS Take 500 mg by mouth daily.    [provider]  ?fluticasone (FLONASE) 50 MCG/ACT nasal spray Place 2 sprays into both nostrils daily. 07/03/20   [provider]  ?Lysine 1000 MG TABS Take  1,000 mg by mouth daily.    [provider]  ?Magnesium 400 MG CAPS Take 400 mg by mouth daily.    [provider]  ?metoprolol succinate (TOPROL-XL) 25 MG 24 hr tablet Take 1 tablet (25 mg total) by mouth daily. 08/30/20   Sherran Needs, NP  ?Misc Natural Products (GLUCOSAMINE CHONDROITIN ADV PO) Take 1 tablet by mouth daily.    [provider]  ?Multiple Vitamin (MULTIVITAMIN WITH MINERALS) TABS tablet Take 1 tablet by mouth daily.    [provider]  ?Omega-3 Fatty Acids (FISH OIL) 1000 MG CAPS Take 1,000 mg by mouth daily.    [provider]  ?vitamin E 400 UNIT capsule Take 400 Units by mouth daily.    [provider]  ?zinc gluconate 50 MG tablet Take 50 mg by mouth daily.    [provider]  ?   ? ?Allergies    ?Morphine and related   ? ?Review of Systems   ?Review of Systems ? ?Physical Exam ?Updated Vital Signs ? ?ED Triage Vitals [08/24/21 1458]  ?Enc Vitals Group  ?   BP 136/89  ?   Pulse Rate 95  ?   Resp 18  ?   Temp 97.9 ?F (36.6 ?C)  ?   Temp Source Oral  ?   SpO2 94 %  ?   Weight   ?  Height   ?   Head Circumference   ?   Peak Flow   ?   Pain Score 10  ?   Pain Loc   ?   Pain Edu?   ?   Excl. in [place]?   ? ? ?Physical Exam ?Vitals and nursing note reviewed.  ?Constitutional:   ?   General: She is not in acute distress. ?   Appearance: She is well-developed. She is not ill-appearing.  ?HENT:  ?   Head: Normocephalic and atraumatic.  ?   Mouth/Throat:  ?   Mouth: Mucous membranes are moist.  ?Eyes:  ?   Extraocular Movements: Extraocular movements intact.  ?   Conjunctiva/sclera: Conjunctivae normal.  ?   Pupils: Pupils are equal, round, and reactive to light.  ?Cardiovascular:  ?   Rate and Rhythm: Normal rate and regular rhythm.  ?   Pulses: Normal pulses.  ?   Heart sounds: Normal heart sounds. No murmur heard. ?Pulmonary:  ?   Effort: Pulmonary effort is normal. No respiratory distress.  ?   Breath sounds: Normal breath sounds.   ?Abdominal:  ?   Palpations: Abdomen is soft.  ?   Tenderness: There is no abdominal tenderness.  ?Musculoskeletal:     ?   General: Tenderness present. No swelling or deformity.  ?   Cervical back: Normal range of motion and neck supple. No tenderness.  ?   Comments: Tenderness to the left shoulder, too much discomfort to attempt range of motion testing, no other tenderness elsewhere  ?Skin: ?   General: Skin is warm and dry.  ?   Capillary Refill: Capillary refill takes less than 2 seconds.  ?Neurological:  ?   General: No focal deficit present.  ?   Mental Status: She is alert and oriented to person, place, and time.  ?   Comments: Motor function is intact in the left upper extremity  ?Psychiatric:     ?   Mood and Affect: Mood normal.  ? ? ?ED Results / Procedures / Treatments   ?Labs ?(all labs ordered are listed, but only abnormal results are displayed) ?Labs Reviewed - No data to display ? ?EKG ?None ? ?Radiology ?DG Forearm Left ? ?Result Date: 08/24/2021 ?CLINICAL DATA:  Fall, pain EXAM: LEFT SHOULDER - 2+ VIEW; LEFT FOREARM - 2 VIEW COMPARISON:  None. FINDINGS: Minimally impacted, comminuted fracture of the surgical neck of the left humerus. The glenohumeral joint is in anatomic apposition. Mild acromioclavicular arthrosis. No fracture or dislocation of the left radius or ulna. Soft tissues are unremarkable. IMPRESSION: 1. Minimally impacted, comminuted fracture of the surgical neck of the left humerus. The glenohumeral joint is in anatomic apposition. Consider CT to further evaluate for fracture anatomy of proximal humerus. 2. No fracture or dislocation of the left radius or ulna. Electronically Signed   By: Delanna Ahmadi M.D.   On: 08/24/2021 16:04  ? ?DG Shoulder Left ? ?Result Date: 08/24/2021 ?CLINICAL DATA:  Fall, pain EXAM: LEFT SHOULDER - 2+ VIEW; LEFT FOREARM - 2 VIEW COMPARISON:  None. FINDINGS: Minimally impacted, comminuted fracture of the surgical neck of the left humerus. The glenohumeral  joint is in anatomic apposition. Mild acromioclavicular arthrosis. No fracture or dislocation of the left radius or ulna. Soft tissues are unremarkable. IMPRESSION: 1. Minimally impacted, comminuted fracture of the surgical neck of the left humerus. The glenohumeral joint is in anatomic apposition. Consider CT to further evaluate for fracture anatomy of proximal humerus. 2. No fracture  or dislocation of the left radius or ulna. Electronically Signed   By: Delanna Ahmadi M.D.   On: 08/24/2021 16:04   ? ?Procedures ?Procedures  ? ? ?Medications Ordered in ED ?Medications  ?oxyCODONE (Oxy IR/ROXICODONE) immediate release tablet 5 mg (has no administration in time range)  ?HYDROcodone-acetaminophen (NORCO/VICODIN) 5-325 MG per tablet 1 tablet (1 tablet Oral Given 08/24/21 1635)  ? ? ?ED Course/ Medical Decision Making/ A&P ?  ?                        ?Medical Decision Making ?Amount and/or Complexity of Data Reviewed ?Radiology: ordered. ? ?Risk ?Prescription drug management. ? ? ?Julie Woodward is here with left shoulder pain after fall.  Unremarkable vitals.  No fever.  Patient states that she was cleaning off a deck when a board broke from underneath her.  She reached out with her arm out stretched.  Had pain in the left shoulder afterwards.  Did not hit her head.  Did not lose consciousness.  No head or neck pain.  No other extremity pain.  Patient is neurovascular neuromuscular intact on exam.  Has good pulses in the upper extremities.  X-rays were obtained of her left shoulder and forearm and were consistent with a minimally impacted/comminuted fracture of the surgical neck of the left humerus.  The glenohumeral joint is approximated.  There is no dislocation.  She was given narcotic pain medicine while in the ED.  She follows with [facility] and she will follow-up with them.  She is placed in a sling immobilizer.  Given weightbearing instructions.  Given pain medication instructions.  No concern for other  injuries.  Discharged in good condition. ? ?This chart was dictated using voice recognition software.  Despite best efforts to proofread,  errors can occur which can change the documentation meaning.  ? ? ? ? ? ? ? ? ?Fi

## 2021-08-24 NOTE — ED Provider Triage Note (Signed)
Emergency Medicine Provider Triage Evaluation Note ? ?Julie Woodward , a 75 y.o. female  was evaluated in triage.  Pt complains of left shoulder and elbow pain.  Patient states she fell through a rotten board on her deck.  She denies any other injury.  Has been ambulatory since the incident. ? ?Review of Systems  ?Positive: As above ?Negative: As above ? ?Physical Exam  ?BP 136/89 (BP Location: Right Arm)   Pulse 95   Temp 97.9 ?F (36.6 ?C) (Oral)   Resp 18   SpO2 94%  ?Gen:   Awake, no distress   ?Resp:  Normal effort  ?MSK:   Left shoulder with tenderness to palpation.  Range of motion limited secondary to pain.  Elbow without tenderness to palpation.  2+ radial pulse present.  Full range of motion in all digits of the left hand. ?Other:   ? ?Medical Decision Making  ?Medically screening exam initiated at 3:04 PM.  Appropriate orders placed.  HARIKA LAIDLAW was informed that the remainder of the evaluation will be completed by another provider, this initial triage assessment does not replace that evaluation, and the importance of remaining in the ED until their evaluation is complete. ? ? ?  ?Evlyn Courier, PA-C ?08/24/21 1505 ? ?

## 2021-08-26 DIAGNOSIS — S42202A Unspecified fracture of upper end of left humerus, initial encounter for closed fracture: Secondary | ICD-10-CM | POA: Diagnosis not present

## 2021-08-26 DIAGNOSIS — M25512 Pain in left shoulder: Secondary | ICD-10-CM | POA: Diagnosis not present

## 2021-09-02 DIAGNOSIS — S42202A Unspecified fracture of upper end of left humerus, initial encounter for closed fracture: Secondary | ICD-10-CM | POA: Diagnosis not present

## 2021-10-07 DIAGNOSIS — S42202D Unspecified fracture of upper end of left humerus, subsequent encounter for fracture with routine healing: Secondary | ICD-10-CM | POA: Diagnosis not present

## 2021-10-17 DIAGNOSIS — M25512 Pain in left shoulder: Secondary | ICD-10-CM | POA: Diagnosis not present

## 2021-10-17 DIAGNOSIS — M25612 Stiffness of left shoulder, not elsewhere classified: Secondary | ICD-10-CM | POA: Diagnosis not present

## 2021-11-18 DIAGNOSIS — S42202D Unspecified fracture of upper end of left humerus, subsequent encounter for fracture with routine healing: Secondary | ICD-10-CM | POA: Diagnosis not present

## 2022-03-11 DIAGNOSIS — K219 Gastro-esophageal reflux disease without esophagitis: Secondary | ICD-10-CM | POA: Diagnosis not present

## 2022-03-11 DIAGNOSIS — E7800 Pure hypercholesterolemia, unspecified: Secondary | ICD-10-CM | POA: Diagnosis not present

## 2022-03-11 DIAGNOSIS — Z1331 Encounter for screening for depression: Secondary | ICD-10-CM | POA: Diagnosis not present

## 2022-03-11 DIAGNOSIS — Z0000 Encounter for general adult medical examination without abnormal findings: Secondary | ICD-10-CM | POA: Diagnosis not present

## 2022-03-11 DIAGNOSIS — E039 Hypothyroidism, unspecified: Secondary | ICD-10-CM | POA: Diagnosis not present

## 2022-07-10 ENCOUNTER — Encounter (HOSPITAL_COMMUNITY): Payer: Self-pay | Admitting: *Deleted

## 2022-09-01 DIAGNOSIS — R29810 Facial weakness: Secondary | ICD-10-CM | POA: Diagnosis not present

## 2022-09-01 DIAGNOSIS — Z86018 Personal history of other benign neoplasm: Secondary | ICD-10-CM | POA: Diagnosis not present

## 2022-09-01 DIAGNOSIS — Z9889 Other specified postprocedural states: Secondary | ICD-10-CM | POA: Diagnosis not present

## 2022-09-01 DIAGNOSIS — J328 Other chronic sinusitis: Secondary | ICD-10-CM | POA: Diagnosis not present

## 2022-11-17 DIAGNOSIS — M25572 Pain in left ankle and joints of left foot: Secondary | ICD-10-CM | POA: Diagnosis not present

## 2022-11-27 DIAGNOSIS — S93402A Sprain of unspecified ligament of left ankle, initial encounter: Secondary | ICD-10-CM | POA: Diagnosis not present

## 2022-11-27 DIAGNOSIS — M25572 Pain in left ankle and joints of left foot: Secondary | ICD-10-CM | POA: Diagnosis not present

## 2023-03-25 DIAGNOSIS — E7800 Pure hypercholesterolemia, unspecified: Secondary | ICD-10-CM | POA: Diagnosis not present

## 2023-03-26 DIAGNOSIS — Z961 Presence of intraocular lens: Secondary | ICD-10-CM | POA: Diagnosis not present

## 2023-04-17 DIAGNOSIS — H26493 Other secondary cataract, bilateral: Secondary | ICD-10-CM | POA: Diagnosis not present

## 2023-04-17 DIAGNOSIS — H26492 Other secondary cataract, left eye: Secondary | ICD-10-CM | POA: Diagnosis not present

## 2023-05-04 DIAGNOSIS — H26491 Other secondary cataract, right eye: Secondary | ICD-10-CM | POA: Diagnosis not present

## 2023-05-19 DIAGNOSIS — J34829 Nasal valve collapse, unspecified: Secondary | ICD-10-CM | POA: Diagnosis not present

## 2023-05-19 DIAGNOSIS — Z9889 Other specified postprocedural states: Secondary | ICD-10-CM | POA: Diagnosis not present

## 2023-06-25 DIAGNOSIS — J019 Acute sinusitis, unspecified: Secondary | ICD-10-CM | POA: Diagnosis not present

## 2023-06-25 DIAGNOSIS — R051 Acute cough: Secondary | ICD-10-CM | POA: Diagnosis not present

## 2024-04-07 DIAGNOSIS — E7800 Pure hypercholesterolemia, unspecified: Secondary | ICD-10-CM | POA: Diagnosis not present

## 2024-04-07 DIAGNOSIS — Z8639 Personal history of other endocrine, nutritional and metabolic disease: Secondary | ICD-10-CM | POA: Diagnosis not present

## 2024-04-19 DIAGNOSIS — E039 Hypothyroidism, unspecified: Secondary | ICD-10-CM | POA: Diagnosis not present

## 2024-05-02 DIAGNOSIS — K219 Gastro-esophageal reflux disease without esophagitis: Secondary | ICD-10-CM | POA: Diagnosis not present

## 2024-05-02 DIAGNOSIS — Z860101 Personal history of adenomatous and serrated colon polyps: Secondary | ICD-10-CM | POA: Diagnosis not present
